# Patient Record
Sex: Female | Born: 1950 | Race: White | Hispanic: No | Marital: Married | Smoking: Never smoker
Health system: Southern US, Community
[De-identification: ages and names within clinical notes are randomized; demographics above are authoritative.]

---

## 2015-08-11 ENCOUNTER — Encounter: Payer: Self-pay | Admitting: Sports Medicine

## 2015-08-11 ENCOUNTER — Ambulatory Visit (INDEPENDENT_AMBULATORY_CARE_PROVIDER_SITE_OTHER): Payer: BC Managed Care – PPO

## 2015-08-11 ENCOUNTER — Ambulatory Visit (INDEPENDENT_AMBULATORY_CARE_PROVIDER_SITE_OTHER): Payer: BC Managed Care – PPO | Admitting: Sports Medicine

## 2015-08-11 VITALS — BP 146/83 | HR 76 | Wt 223.0 lb

## 2015-08-11 DIAGNOSIS — M533 Sacrococcygeal disorders, not elsewhere classified: Secondary | ICD-10-CM | POA: Diagnosis not present

## 2015-08-11 DIAGNOSIS — M609 Myositis, unspecified: Secondary | ICD-10-CM

## 2015-08-11 DIAGNOSIS — M545 Low back pain, unspecified: Secondary | ICD-10-CM

## 2015-08-11 DIAGNOSIS — IMO0001 Reserved for inherently not codable concepts without codable children: Secondary | ICD-10-CM

## 2015-08-11 DIAGNOSIS — M791 Myalgia: Secondary | ICD-10-CM | POA: Diagnosis not present

## 2015-08-11 DIAGNOSIS — M47818 Spondylosis without myelopathy or radiculopathy, sacral and sacrococcygeal region: Secondary | ICD-10-CM

## 2015-08-11 DIAGNOSIS — M461 Sacroiliitis, not elsewhere classified: Secondary | ICD-10-CM | POA: Insufficient documentation

## 2015-08-11 MED ORDER — CYCLOBENZAPRINE HCL 10 MG PO TABS
ORAL_TABLET | ORAL | Status: DC
Start: 2015-08-11 — End: 2015-09-11

## 2015-08-11 MED ORDER — MELOXICAM 15 MG PO TABS: ORAL_TABLET | ORAL | Status: DC

## 2015-08-11 NOTE — Progress Notes (Signed)
   Subjective:    I'm seeing this patient as a consultation for:  Dr. Julius Bowels  CC:  Low back pain  HPI: This is a pleasant 64 year old female with a history of breast cancer comes in with a three-week history of pain in the left side of her low back without radiculopathy, no bowel or bladder dysfunction, saddle numbness, pain is worse when laying flat in bed, moderate, persistent, no radiation. No constitutional symptoms. She does desire aggressive interventional treatment today.  Past medical history, Surgical history, Family history not pertinant except as noted below, Social history, Allergies, and medications have been entered into the medical record, reviewed, and no changes needed.   Review of Systems: No headache, visual changes, nausea, vomiting, diarrhea, constipation, dizziness, abdominal pain, skin rash, fevers, chills, night sweats, weight loss, swollen lymph nodes, body aches, joint swelling, muscle aches, chest pain, shortness of breath, mood changes, visual or auditory hallucinations.   Objective:   General: Well Developed, well nourished, and in no acute distress.  Neuro/Psych: Alert and oriented x3, extra-ocular muscles intact, able to move all 4 extremities, sensation grossly intact. Skin: Warm and dry, no rashes noted.  Respiratory: Not using accessory muscles, speaking in full sentences, trachea midline.  Cardiovascular: Pulses palpable, no extremity edema. Abdomen: Does not appear distended. Back Exam:  Inspection: Unremarkable  Motion: Flexion 45 deg, Extension 45 deg, Side Bending to 45 deg bilaterally,  Rotation to 45 deg bilaterally  SLR laying: Negative  XSLR laying: Negative  Palpable tenderness: left sacroiliac joint. FABER: negative. Sensory change: Gross sensation intact to all lumbar and sacral dermatomes.  Reflexes: 2+ at both patellar tendons, 2+ at achilles tendons, Babinski's downgoing.  Strength at foot  Plantar-flexion: 5/5 Dorsi-flexion: 5/5  Eversion: 5/5 Inversion: 5/5  Leg strength  Quad: 5/5 Hamstring: 5/5 Hip flexor: 5/5 Hip abductors: 5/5  Gait unremarkable.  Procedure: Real-time Ultrasound Guided Injection of left sacroiliac joint Device: GE Logiq E  Verbal informed consent obtained.  Time-out conducted.  Noted no overlying erythema, induration, or other signs of local infection.  Skin prepped in a sterile fashion.  Local anesthesia: Topical Ethyl chloride.  With sterile technique and under real time ultrasound guidance:  Spinal needle advanced into the SI joint taking care to avoid the S1 foramen, 1 mL Kenalog 40, 4 mL lidocaine injected easily, there was concordant pain during the injection Completed without difficulty  Pain immediately resolved suggesting accurate placement of the medication.  Advised to call if fevers/chills, erythema, induration, drainage, or persistent bleeding.  Images permanently stored and available for review in the ultrasound unit.  Impression: Technically successful ultrasound guided injection.  Impression and Recommendations:   This case required medical decision making of moderate complexity.

## 2015-08-11 NOTE — Assessment & Plan Note (Signed)
Pain is axial and localized over the left sacroiliac joint. Nothing radicular. She does have a history of breast cancer so we will obtain reimaging with an x-ray. Sacroiliac joint injection, formal physical therapy, meloxicam, Flexeril at bedtime.  Return in one month .

## 2015-08-22 ENCOUNTER — Encounter: Payer: Self-pay | Admitting: Rehabilitative and Restorative Service Providers"

## 2015-08-22 ENCOUNTER — Ambulatory Visit (INDEPENDENT_AMBULATORY_CARE_PROVIDER_SITE_OTHER): Payer: BC Managed Care – PPO | Admitting: Rehabilitative and Restorative Service Providers"

## 2015-08-22 DIAGNOSIS — R293 Abnormal posture: Secondary | ICD-10-CM

## 2015-08-22 DIAGNOSIS — Z7409 Other reduced mobility: Secondary | ICD-10-CM | POA: Diagnosis not present

## 2015-08-22 DIAGNOSIS — R531 Weakness: Secondary | ICD-10-CM

## 2015-08-22 DIAGNOSIS — R6889 Other general symptoms and signs: Secondary | ICD-10-CM

## 2015-08-22 DIAGNOSIS — M545 Low back pain, unspecified: Secondary | ICD-10-CM

## 2015-08-22 DIAGNOSIS — M623 Immobility syndrome (paraplegic): Secondary | ICD-10-CM | POA: Diagnosis not present

## 2015-08-22 DIAGNOSIS — M2560 Stiffness of unspecified joint, not elsewhere classified: Secondary | ICD-10-CM

## 2015-08-22 NOTE — Patient Instructions (Signed)
Abdominal Bracing With Pelvic Floor (Hook-Lying) With neutral spine, tighten pelvic floor and tighten abdominals sucking belly button to back bone; tighten back muscles at waist.  Repeat _10__ times.Hold 10-20 sec  Do _several __ times a day. Start lying on back progress to sitting; standing; walking and shelving books(functional activities)    HIP: Hamstrings - Supine Place strap around foot. Raise leg up, keep knee straight.  Hold _30__ seconds. _3__ reps per set, __2-3_ sets per day   IT band stretch  As above bring leg across body keeping knee straight Hold 30 sec 3 reps 2-3 times/day

## 2015-08-22 NOTE — Therapy (Signed)
[facility] [place] [address] [place], [place], [address] Phone: [phone]   Fax:  [phone]  Physical Therapy Evaluation  Patient Details  Name: Meredith Mcgee MRN: [id number] Date of Birth: [birth year] Referring Provider:  Monica Becton,*  Encounter Date: 08/22/2015      PT End of Session - 08/22/15 1115    Visit Number 1   Number of Visits 12   Date for PT Re-Evaluation 10/17/15   PT Start Time 1018   PT Stop Time 1108   PT Time Calculation (min) 50 min   Activity Tolerance Patient tolerated treatment well      History reviewed. No pertinent past medical history.  History reviewed. No pertinent past surgical history.  There were no vitals filed for this visit.  Visit Diagnosis:  Left-sided low back pain without sciatica - Plan: PT plan of care cert/re-cert  Stiffness due to immobility - Plan: PT plan of care cert/re-cert  Abnormal posture - Plan: PT plan of care cert/re-cert  Decreased strength, endurance, and mobility - Plan: PT plan of care cert/re-cert      Subjective Assessment - 08/22/15 1027    Subjective Meredith Mcgee reports that she began having Lt LBP 3-4 weeks ago. She awoke one morning with back pain. Symptoms wiould improve during the day but return at night, awakening her form sleep. Seen by Dr T and treated with injection on SI joint with some improvement after a couple of days. He also treated her with NASIDs and muscle relaxant with good improvement. Patient has been using her TENS unit at home to treat pain.   Pertinent History PT for Lt LE radiculopathy for ~3 months 2013 symptoms resloved   How long can you sit comfortably? no limit   How long can you stand comfortably? 3-4 hours   How long can you walk comfortably? 30 min   Diagnostic tests xray - DDD   Patient Stated Goals Get rid of the pain - prevent pain from coming back   Currently in Pain? No/denies   Pain Location Back   Pain  Orientation Left   Pain Type Acute pain   Pain Radiating Towards across back at times not lately   Pain Onset 1 to 4 weeks ago   Pain Frequency Intermittent   Aggravating Factors  unknown   Pain Relieving Factors meds; TENS unit            [facility] PT Assessment - 08/22/15 0001    Assessment   Medical Diagnosis Lt LBP   Onset Date/Surgical Date 08/04/15   Hand Dominance Right   Next MD Visit 09/11/15   Prior Therapy 2013 for Lt lumbar radiculopathy   Precautions   Precautions None   Balance Screen   Has the patient fallen in the past 6 months No   Has the patient had a decrease in activity level because of a fear of falling?  No   Is the patient reluctant to leave their home because of a fear of falling?  No   Home Environment   Additional Comments no difficulty    Prior Function   Level of Independence Independent   Vocation Retired;Part time employment   [employer] 15 hours/wk no longer than 5 hr/day standing; walking; sitting; bending; lifting books; reshelving   Observation/Other Assessments   Focus on Therapeutic Outcomes (FOTO)  27% limitation   Sensation   Additional Comments WFL's    Posture/Postural Control   Posture Comments head forward;  increased thoracic kyphosis; head of the humerus anterior in orientation; knees hyperextended; LE's ER    AROM   Lumbar Flexion 50%  pull in hamstrings   Lumbar Extension 60%   Lumbar - Right Side Bend 55%   Lumbar - Left Side Bend 55%  pull in Lt LB   Lumbar - Right Rotation 35%   Lumbar - Left Rotation 30%   Strength   Overall Strength Comments 5/5 bilat LE's except hip extension 4+/5 bilat   Flexibility   Hamstrings Rt 61 deg; Lt 64 deg   Quadriceps 6-7 in heel to buttock   ITB tight bilat   Piriformis tight Lt > Rt   Palpation   Spinal mobility WFL - mild tenderness lumbar spine with PA mobs - no pain   Palpation comment tightness Lt piriformis/hip abductors                   [facility]  Adult PT Treatment/Exercise - 08/22/15 0001    Self-Care   Self-Care --  spine care; importance of regular exercise   Neuro Re-ed    Neuro Re-ed Details  postural correction   Lumbar Exercises: Stretches   Passive Hamstring Stretch 3 reps;30 seconds  with strap   ITB Stretch 3 reps;30 seconds  with strap   Lumbar Exercises: Supine   AB Set Limitations 3 part core 10 sec hold 10 reps                PT Education - 08/22/15 1114    Education provided Yes   Education Details importance of regular exercise; postrural education; HEP   Person(s) Educated Patient   Methods Explanation;Demonstration;Tactile cues;Verbal cues;Handout   Comprehension Verbalized understanding;Returned demonstration;Verbal cues required;Tactile cues required             PT Long Term Goals - 08/22/15 1125    PT LONG TERM GOAL #1   Title Patient I in HEP for discharge 10/17/15   Time 6   Period Weeks   Status New   PT LONG TERM GOAL #2   Title Patient reports consistency with reuglar HEP 10/17/15   Time 6   Period Weeks   Status New   PT LONG TERM GOAL #3   Title Improve hamstring flexibility 10-12 degress bilat 10/17/15   Time 6   Period Weeks   Status New   PT LONG TERM GOAL #4   Title Improve FOTO to </=23% limitation 10/17/15   Time 6   Period Weeks   Status New   PT LONG TERM GOAL #5   Title Increase strengtrh bialt hip extension to 5/5 10/17/15   Time 6   Period Weeks   Status New               Plan - 08/22/15 1118    Clinical Impression Statement Meredith Mcgee presents with reolving Lt lumbar pain without radiculopathy. She has limited trunk and LE mobility/ROM; poor posture and alignment; weakness bilat hip extension; tightness through Lt piriformis and bilat hamstrings and quads; limited functional activity level. She has responded well to injectionand meds as well asa home TENS unit to manage pain and has no pain today. Meredith Mcgee will benefit from PT to address problems as  noted and work to improve core stability, progressing patient to I HEP on a regular basis to prevent recurrent problems.    Pt will benefit from skilled therapeutic intervention in order to improve on the following deficits Postural dysfunction;Decreased range of motion;Decreased mobility;Decreased strength;Increased  fascial restricitons;Decreased endurance;Decreased activity tolerance;Pain   Rehab Potential Good   PT Frequency 1x / week   PT Duration 6 weeks   PT Treatment/Interventions Patient/family education;ADLs/Self Care Home Management;Therapeutic exercise;Therapeutic activities;Dry needling;Manual techniques;Cryotherapy;Electrical Stimulation;Moist Heat;Ultrasound   PT Next Visit Plan check HEP especially 3 part core; progress lumbar stabilization as tolerated; add doorway stretch and posterior shoulder girdle strengthening; modalities only as needed to address pain - focus will be on preventing recurrent problems   PT Home Exercise Plan HEP    Consulted and Agree with Plan of Care Patient         Problem List Patient Active Problem List   Diagnosis Date Noted  . Left-sided low back pain without sciatica 08/11/2015    Iliyana Convey Rober Minion PT, MPH 08/22/2015, 11:34 AM  [facility] [address] [place] [address] [place], [place], [address] Phone: [phone]   Fax:  [phone]

## 2015-09-01 ENCOUNTER — Encounter: Payer: Self-pay | Admitting: Physical Therapy

## 2015-09-07 ENCOUNTER — Ambulatory Visit (INDEPENDENT_AMBULATORY_CARE_PROVIDER_SITE_OTHER): Payer: BC Managed Care – PPO | Admitting: Physical Therapy

## 2015-09-07 DIAGNOSIS — Z7409 Other reduced mobility: Secondary | ICD-10-CM | POA: Diagnosis not present

## 2015-09-07 DIAGNOSIS — M623 Immobility syndrome (paraplegic): Secondary | ICD-10-CM | POA: Diagnosis not present

## 2015-09-07 DIAGNOSIS — R293 Abnormal posture: Secondary | ICD-10-CM | POA: Diagnosis not present

## 2015-09-07 DIAGNOSIS — R531 Weakness: Secondary | ICD-10-CM

## 2015-09-07 DIAGNOSIS — R6889 Other general symptoms and signs: Secondary | ICD-10-CM

## 2015-09-07 DIAGNOSIS — M2560 Stiffness of unspecified joint, not elsewhere classified: Secondary | ICD-10-CM

## 2015-09-07 NOTE — Patient Instructions (Signed)
Piriformis Stretch, Sitting PNF    Sit, one ankle on opposite knee, same-side hand on crossed knee. Push down on knee, keeping spine straight. Lean torso forward until tension is felt in hamstrings and gluteals of crossed-leg side. Contract hip muscles so knee rotates further toward floor, resisting with hand. Hold _30__ seconds, 2 reps. Release tension and bring torso closer to calf. Do _1-2__ sessions per day.   Hamstring Step 2    Left foot relaxed, knee straight, other leg bent, foot flat. Raise straight leg further upward to maximal range. Hold 30___ seconds. Relax leg completely down. Repeat _2__ times.   Outer Hip Stretch: Reclined IT Band Stretch (Strap)    Strap around opposite foot, pull across only as far as possible with shoulders on mat. Hold for __30__ seconds. Repeat _2-3___ times each leg.  Abdominal Bracing With Pelvic Floor (Hook-Lying)   With neutral spine, tighten pelvic floor and abdominals. Hold 10 seconds. Repeat __10_ times. Do _1__ times a day.    Knee to Chest: Transverse Plane Stability  Tighten abdominal muscles.  Bring one knee up, then return. Be sure pelvis does not roll side to side. Keep pelvis still. Lift knee __10_ times each leg. Restabilize pelvis. Repeat with other leg. Do _1-2__ sets, _1__ times per day.   Hip External Rotation With Pillow: Transverse Plane Stability  Tighten abdominal muscles. Keep both knees bent. Slowly roll bent knee out. Be sure pelvis does not rotate. Do _10__ times. Restabilize pelvis. Repeat with other leg. Do _1-2__ sets, _1__ times per day.  Heel Slide: 4-10 Inches - Transverse Plane Stability  Tighten abdominal muscles.  Slide heel 4 inches down. Be sure pelvis does not rotate. Do _5-10__ times. Restabilize pelvis. Repeat with other leg. Do __1_ sets, _1__ times per day.   [facility] [address] [place], [place] [address]  [phone]  (office) [phone] (fax)

## 2015-09-07 NOTE — Therapy (Addendum)
[place], [place], [address] Phone: [phone]   Fax:  [phone]  Physical Therapy Treatment  Patient Details  Name: Meredith Mcgee MRN: [id number] Date of Birth: 1951/10/12 No Data Recorded  Encounter Date: 09/07/2015      PT End of Session - 09/07/15 1109    Visit Number 2   Number of Visits 12   Date for PT Re-Evaluation 10/17/15   PT Start Time 1107   PT Stop Time 4383   PT Time Calculation (min) 42 min   Activity Tolerance Patient tolerated treatment well;No increased pain      No past medical history on file.  No past surgical history on file.  There were no vitals filed for this visit.  Visit Diagnosis:  Stiffness due to immobility  Abnormal posture  Decreased strength, endurance, and mobility      Subjective Assessment - 09/07/15 1107    Subjective Pt reports she has tightness in morning in low back, no longer pain in same area. Stopped taking NSAID and muscle relaxers 1 wk ago.    Currently in Pain? No/denies            [facility] PT Assessment - 09/07/15 0001    Assessment   Medical Diagnosis Lt LBP   Onset Date/Surgical Date 08/04/15   Hand Dominance Right   Next MD Visit 09/11/15   Prior Therapy 2013 for Lt lumbar radiculopathy   Observation/Other Assessments   Focus on Therapeutic Outcomes (FOTO)  11% limited           [facility] Adult PT Treatment/Exercise - 09/07/15 0001    Lumbar Exercises: Stretches   Passive Hamstring Stretch 2 reps;30 seconds  (instructed in seated version)   Piriformis Stretch 2 reps;30 seconds   Lumbar Exercises: Aerobic   Stationary Bike NuStep L4: 15mn    Lumbar Exercises: Standing   Other Standing Lumbar Exercises doorway stretch 30 sec    Lumbar Exercises: Supine   Ab Set 5 reps;5 seconds   Clam 10 reps  each leg, with ab set   Heel Slides 5 reps  each leg, with ab set   Bent Knee Raise 10 reps  each leg, with ab set            PT Education - 09/07/15 1135    Education provided Yes   Education Details HEP- stretches and core exercises, doorway stretch   Person(s) Educated Patient   Methods Explanation;Handout   Comprehension Verbalized understanding             PT Long Term Goals - 09/07/15 1253    PT LONG TERM GOAL #1   Title Patient I in HEP for discharge 10/17/15   Time 6   Period Weeks   Status On-going   PT LONG TERM GOAL #2   Title Patient reports consistency with reuglar HEP 10/17/15   Time 6   Period Weeks   Status On-going   PT LONG TERM GOAL #3   Title Improve hamstring flexibility 10-12 degress bilat 10/17/15   Time 6   Period Weeks   Status On-going   PT LONG TERM GOAL #4   Title Improve FOTO to </=23% limitation 10/17/15   Time 6   Period Weeks   Status Achieved   PT LONG TERM GOAL #5   Title Increase strengtrh bialt hip extension to 5/5 10/17/15   Time 6   Period Weeks   Status On-going  Plan - 09/07/15 1136    Clinical Impression Statement Pt tolerated all exercises without production of pain. Pt has made good progress towards goalsmet LTG # 4. Pt interested in continuation of HEP on own, holding therapy for next 2 wks.  As long as there is no flare up in next 2 wks, pt desires to d/c at that time due to satisfaction of current level of function.    Pt will benefit from skilled therapeutic intervention in order to improve on the following deficits Postural dysfunction;Decreased range of motion;Decreased mobility;Decreased strength;Increased fascial restricitons;Decreased endurance;Decreased activity tolerance;Pain   Rehab Potential Good   PT Frequency 1x / week   PT Duration 6 weeks   PT Next Visit Plan Hold PT sessions for 2 wks.  If returning, continue and progress core stabilization exercises.    Consulted and Agree with Plan of Care Patient        Problem List Patient Active Problem List   Diagnosis Date Noted  . Left-sided low  back pain without sciatica 08/11/2015    Shelbie Hutching 09/07/2015, 12:54 PM  [facility] [place], [place], [address] Phone: [phone]   Fax:  [phone]  Name: Meredith Mcgee MRN: [id number] Date of Birth: Apr 22, 1951    PHYSICAL THERAPY DISCHARGE SUMMARY  Visits from Start of Care: 2  Current functional level related to goals / functional outcomes: Pt pleased with progress and confident with continuing I HEP    Remaining deficits: Needs to continue to work on exercises at home    Education / Equipment: HEP  Plan: Patient agrees to discharge.  Patient goals were met. Patient is being discharged due to meeting the stated rehab goals.  ?????    Celyn P. Helene Kelp PT, MPH 10/09/2015 1:56 PM

## 2015-09-11 ENCOUNTER — Encounter: Payer: Self-pay | Admitting: Sports Medicine

## 2015-09-11 ENCOUNTER — Ambulatory Visit (INDEPENDENT_AMBULATORY_CARE_PROVIDER_SITE_OTHER): Payer: BC Managed Care – PPO | Admitting: Sports Medicine

## 2015-09-11 VITALS — BP 151/70 | HR 66 | Wt 224.0 lb

## 2015-09-11 DIAGNOSIS — M47817 Spondylosis without myelopathy or radiculopathy, lumbosacral region: Secondary | ICD-10-CM | POA: Diagnosis not present

## 2015-09-11 DIAGNOSIS — M47818 Spondylosis without myelopathy or radiculopathy, sacral and sacrococcygeal region: Principal | ICD-10-CM

## 2015-09-11 DIAGNOSIS — M461 Sacroiliitis, not elsewhere classified: Secondary | ICD-10-CM

## 2015-09-11 NOTE — Assessment & Plan Note (Signed)
Good response with 100% relief after left sacroliac joint injection, formal physical therapy, continues to do well. This confirms the left sacroiliac joint as the pain generator, and we can inject her again as needed up to 4 times per year, she will continue with her exercises and establish primary care with one of our other providers. Certainly if persistence of pain she would be a candidate for sacroiliac joint radiofrequency ablation or a fusion.

## 2015-09-11 NOTE — Progress Notes (Signed)
  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 She does desire to establish care with this practice.  Past medical history, Surgical history, Family history not pertinant except as noted below, Social history, Allergies, and medications have been entered into the medical record, reviewed, and no changes needed.   Review of Systems: No fevers, chills, night sweats, weight loss, chest pain, or shortness of breath.   Objective:    General: Well Developed, well nourished, and in no acute distress.  Neuro: Alert and oriented x3, extra-ocular muscles intact, sensation grossly intact.  HEENT: Normocephalic, atraumatic, pupils equal round reactive to light, neck supple, no masses, no lymphadenopathy, thyroid nonpalpable.  Skin: Warm and dry, no rashes. Cardiac: Regular rate and rhythm, no murmurs rubs or gallops, no lower extremity edema.  Respiratory: Clear to auscultation bilaterally. Not using accessory muscles, speaking in full sentences.  Impression and Recommendations:    I spent 25 minutes with this patient, greater than 50% was face-to-face time counseling regarding the above diagnoses

## 2017-02-15 IMAGING — CR DG LUMBAR SPINE COMPLETE 4+V
5 series · 5 of 5 positions shown · non-contrast
Comparison: None.

CLINICAL DATA: Pt here with c/o lower left lumbar pain x3 weeks
with NKI, does not radiate to leg.

Had injection "at SI joint" today per pt.
Hx 0425 "radiculpathy" to lumbar area possible after picking up a
bag of dog food. Other than that, no hx prior injury/surgery.
EXAM:
LUMBAR SPINE - COMPLETE 4+ VIEW

[l-spine ap]
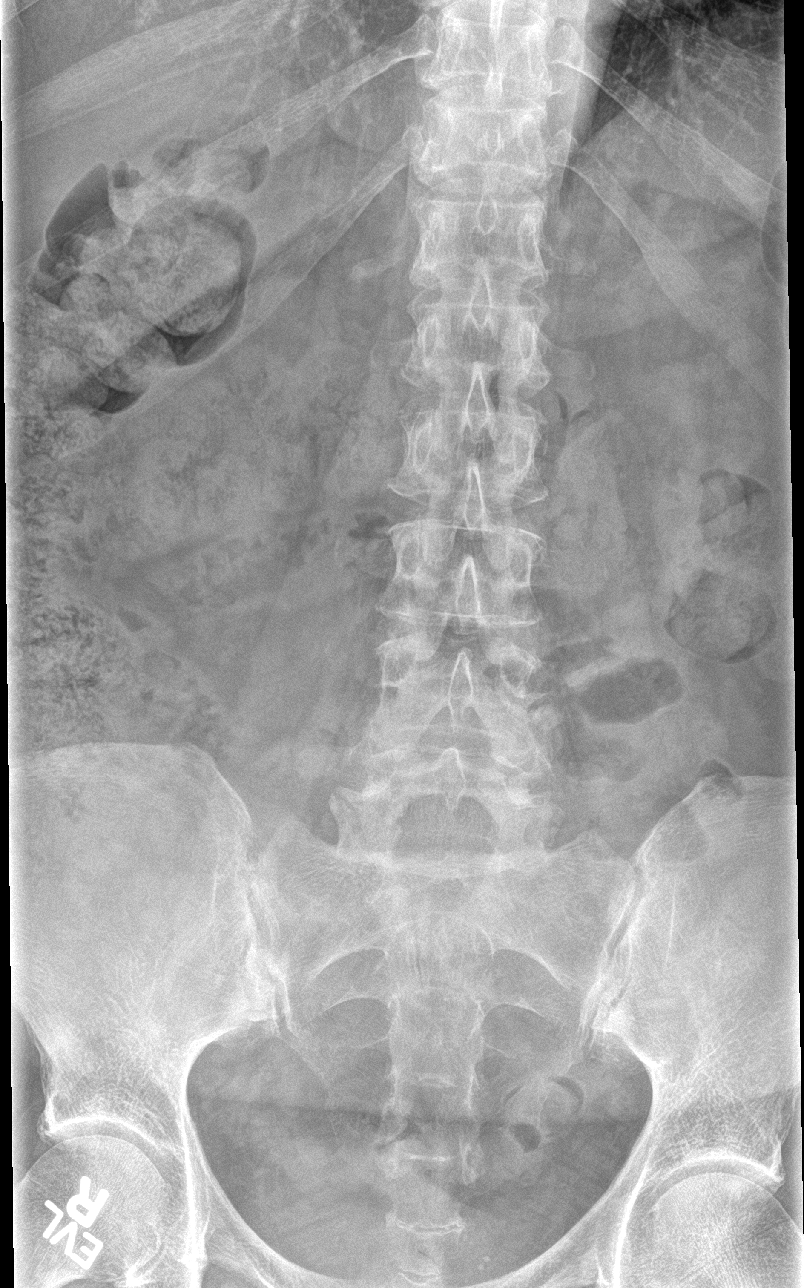

[l-spine obl (1 of 2)]
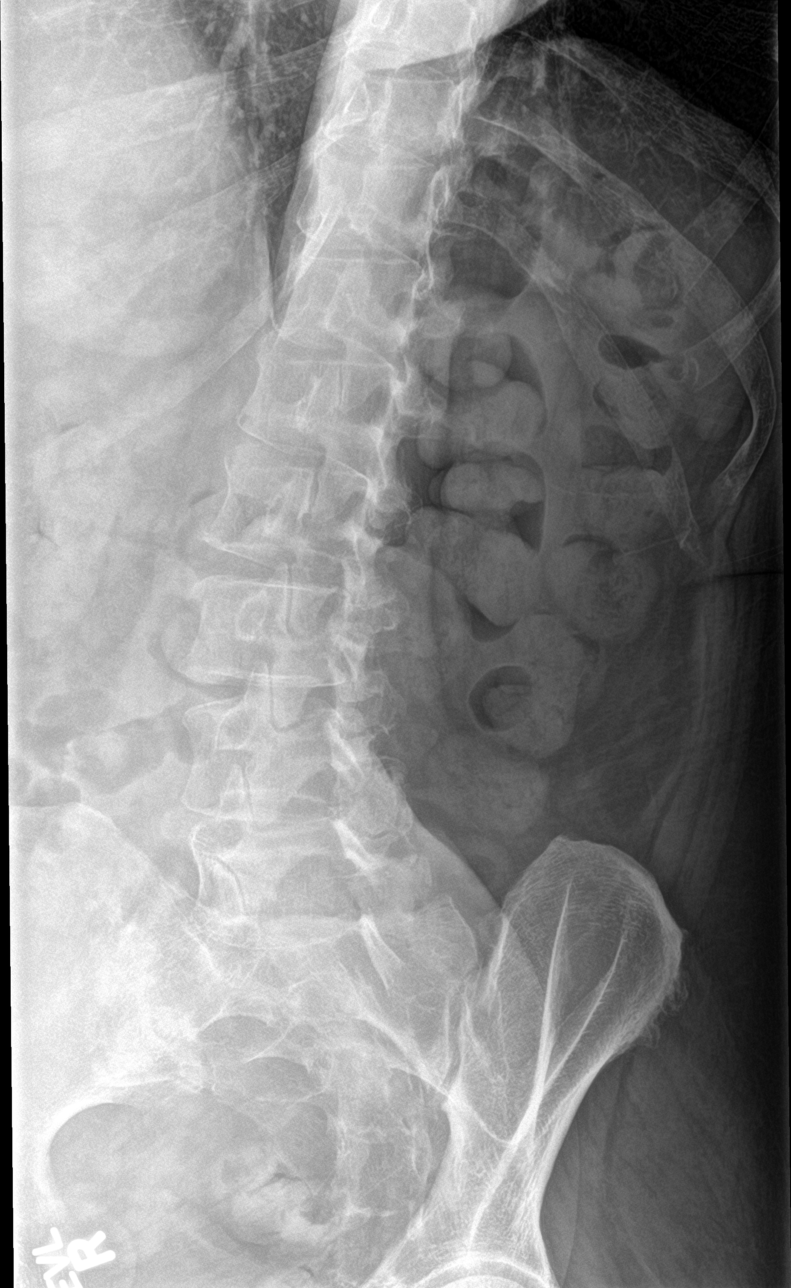

[l-spine obl (2 of 2)]
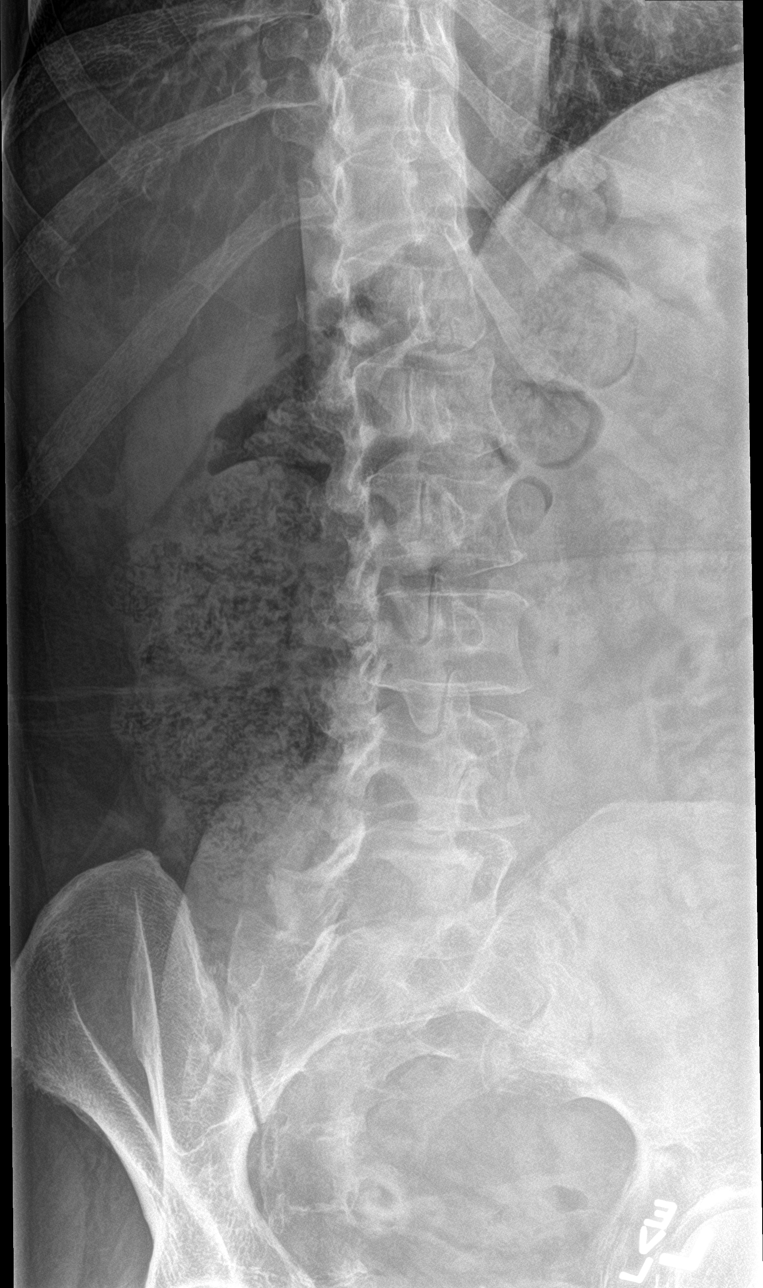

[l-spine lat]
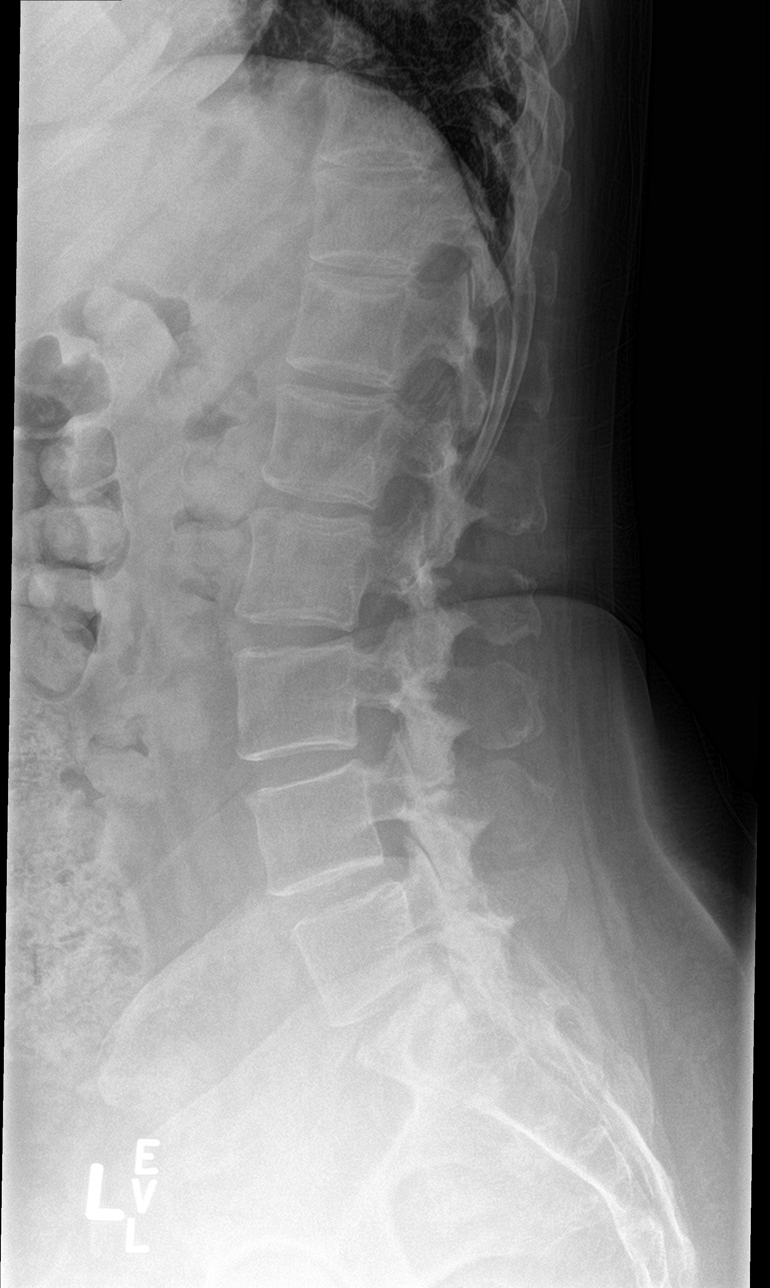

[l-spine spot]
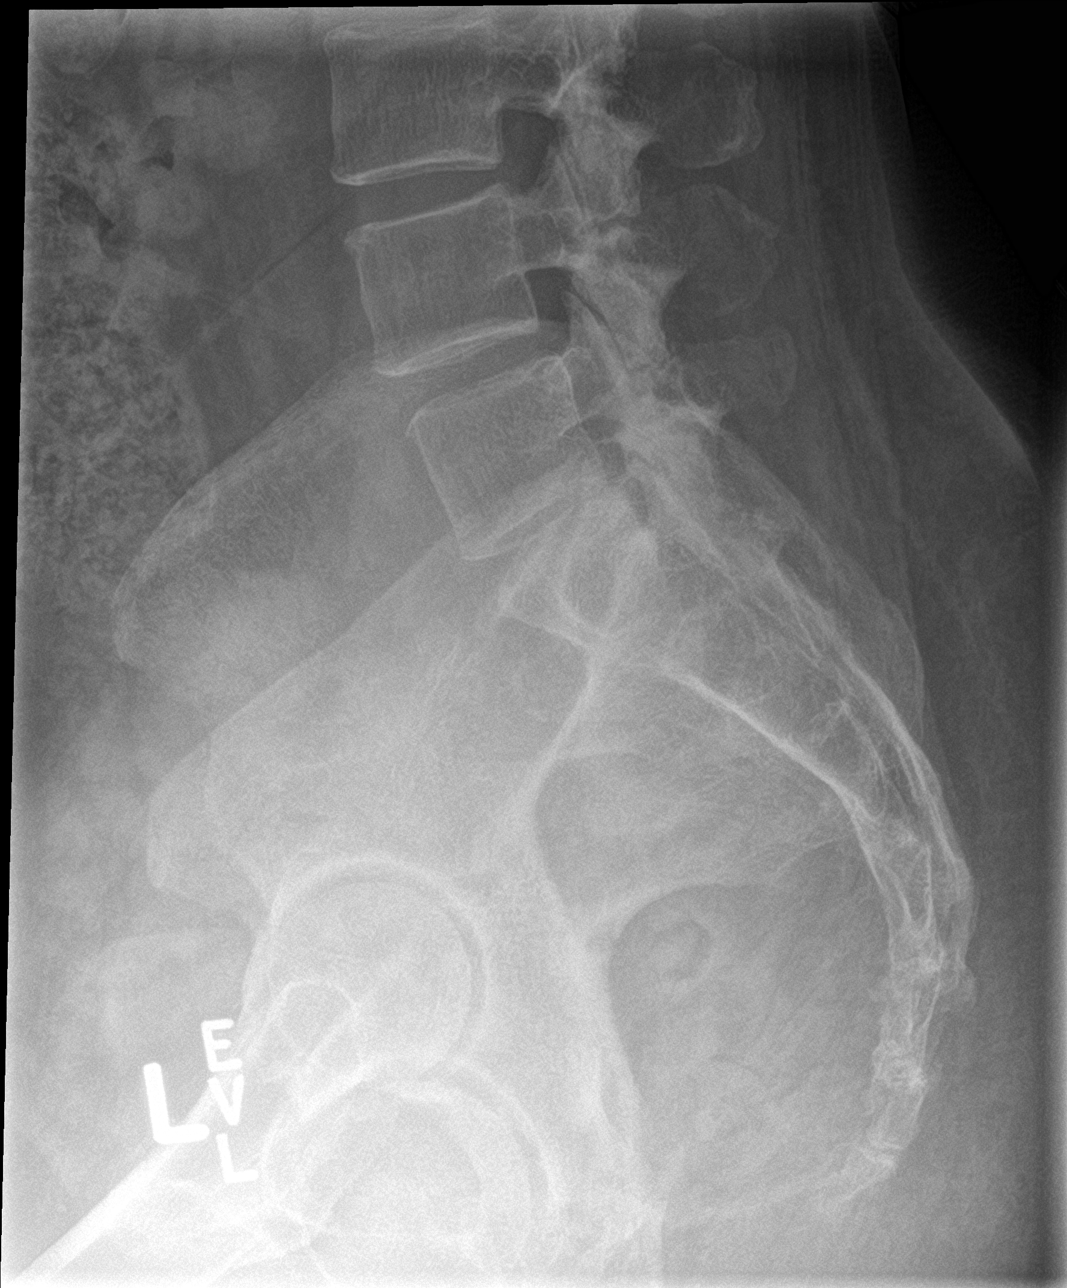

[5 of 5 positions shown; findings below may reference images not displayed]

FINDINGS: There is normal vertebral body stature. There is a grade 1
anterolisthesis of L4 on L5. No other spondylolisthesis. Mild loss
disc height at L4-L5 and L5-S1. Facet degenerative changes are
noted, mostly on the left, at L4-L5 and L5-S1.

Bones are demineralized.  Soft tissues are unremarkable.
IMPRESSION: 1. No fracture or acute finding.
2. Degenerative changes as detailed.

## 2017-04-18 ENCOUNTER — Encounter: Payer: Self-pay | Admitting: Sports Medicine

## 2017-04-18 ENCOUNTER — Ambulatory Visit (INDEPENDENT_AMBULATORY_CARE_PROVIDER_SITE_OTHER): Payer: BC Managed Care – PPO | Admitting: Sports Medicine

## 2017-04-18 ENCOUNTER — Ambulatory Visit (INDEPENDENT_AMBULATORY_CARE_PROVIDER_SITE_OTHER): Payer: BC Managed Care – PPO

## 2017-04-18 DIAGNOSIS — M7061 Trochanteric bursitis, right hip: Secondary | ICD-10-CM

## 2017-04-18 MED ORDER — MELOXICAM 15 MG PO TABS: ORAL_TABLET | ORAL | 3 refills | Status: DC

## 2017-04-18 NOTE — Assessment & Plan Note (Signed)
We will start conservative, physical therapy, meloxicam, x-rays. She does have her daughter's wedding in one month so I would like to see her at least a week before the wedding to do an injection should be required.

## 2017-04-18 NOTE — Progress Notes (Signed)
   Subjective:    I'm seeing this patient as a consultation for:  Dr. Albertina SenegalNelson Pollock  CC: Right hip pain  HPI: This is a pleasant 66 year old female, for the past couple of weeks she's had pain that she localizes over the lateral aspect of the right hip, severe, persistent, no radiation, no trauma, no change in physical activities. She has trouble sleeping on the ipsilateral side.  Her SI joint continues to be pain-free.  Past medical history:  Negative.  See flowsheet/record as well for more information.  Surgical history: Negative.  See flowsheet/record as well for more information.  Family history: Negative.  See flowsheet/record as well for more information.  Social history: Negative.  See flowsheet/record as well for more information.  Allergies, and medications have been entered into the medical record, reviewed, and no changes needed.   Review of Systems: No headache, visual changes, nausea, vomiting, diarrhea, constipation, dizziness, abdominal pain, skin rash, fevers, chills, night sweats, weight loss, swollen lymph nodes, body aches, joint swelling, muscle aches, chest pain, shortness of breath, mood changes, visual or auditory hallucinations.   Objective:   General: Well Developed, well nourished, and in no acute distress.  Neuro/Psych: Alert and oriented x3, extra-ocular muscles intact, able to move all 4 extremities, sensation grossly intact. Skin: Warm and dry, no rashes noted.  Respiratory: Not using accessory muscles, speaking in full sentences, trachea midline.  Cardiovascular: Pulses palpable, no extremity edema. Abdomen: Does not appear distended. Right Hip: ROM IR: 60 Deg, ER: 60 Deg, Flexion: 120 Deg, Extension: 100 Deg, Abduction: 45 Deg, Adduction: 45 Deg Strength IR: 5/5, ER: 5/5, Flexion: 5/5, Extension: 5/5, Abduction: 5/5, Adduction: 5/5 Pelvic alignment unremarkable to inspection and palpation. Standing hip rotation and gait without trendelenburg /  unsteadiness. Greater trochanter with severe tenderness to palpation. No tenderness over piriformis. No SI joint tenderness and normal minimal SI movement.  Impression and Recommendations:   This case required medical decision making of moderate complexity.  Greater trochanteric bursitis, right We will start conservative, physical therapy, meloxicam, x-rays. She does have her daughter's wedding in one month so I would like to see her at least a week before the wedding to do an injection should be required.

## 2017-04-21 ENCOUNTER — Ambulatory Visit (INDEPENDENT_AMBULATORY_CARE_PROVIDER_SITE_OTHER): Payer: BC Managed Care – PPO | Admitting: Rehabilitative and Restorative Service Providers"

## 2017-04-21 ENCOUNTER — Encounter: Payer: Self-pay | Admitting: Rehabilitative and Restorative Service Providers"

## 2017-04-21 DIAGNOSIS — M25551 Pain in right hip: Secondary | ICD-10-CM | POA: Diagnosis not present

## 2017-04-21 DIAGNOSIS — R531 Weakness: Secondary | ICD-10-CM

## 2017-04-21 DIAGNOSIS — R29898 Other symptoms and signs involving the musculoskeletal system: Secondary | ICD-10-CM | POA: Diagnosis not present

## 2017-04-21 DIAGNOSIS — R293 Abnormal posture: Secondary | ICD-10-CM

## 2017-04-21 NOTE — Therapy (Signed)
[facility] [place] [address] [place], [place], [address] Phone: [phone]   Fax:  [phone]  Physical Therapy Evaluation  Patient Details  Name: Meredith Mcgee MRN: [id number] Date of Birth: 06/21/1951 Referring Provider: Dr Benjamin Stain  Encounter Date: 04/21/2017      PT End of Session - 04/21/17 0937    Visit Number 1   Number of Visits 12   Date for PT Re-Evaluation 06/02/17   PT Start Time 0933   PT Stop Time 1030   PT Time Calculation (min) 57 min   Activity Tolerance Patient tolerated treatment well      History reviewed. No pertinent past medical history.  History reviewed. No pertinent surgical history.  There were no vitals filed for this visit.       Subjective Assessment - 04/21/17 0938    Subjective Meredith Mcgee reports that she has been having Rt hip pain since january 2018. She began walking up with pain in the Rt hip. Pain has persisted with no improvement.    Pertinent History LBP 2-3 years ago treated with PT with improvement; 2013 nerve impingement Lt LE out of work for 2-3 months; breast cancer 2000 bilat mastectomy   How long can you sit comfortably? no limit some stiffness    How long can you stand comfortably? no limit   How long can you walk comfortably? no limit - can walk ~ 30 min    Diagnostic tests xrays    Patient Stated Goals get rid of pain and improve stiffness    Currently in Pain? No/denies   Pain Location Hip   Pain Orientation Right   Pain Descriptors / Indicators Sharp;Stabbing   Pain Type Chronic pain   Pain Radiating Towards lateral thigh to mid thigh    Pain Onset More than a month ago   Pain Frequency Intermittent   Aggravating Factors  lying on the Rt side at night   Pain Relieving Factors heat            [facility] PT Assessment - 04/21/17 0001      Assessment   Medical Diagnosis Rt trochanteric bursitis   Referring Provider Dr Benjamin Stain   Onset Date/Surgical  Date 11/18/16   Hand Dominance Right   Next MD Visit 05/09/17   Prior Therapy here for back      Precautions   Precautions None     Balance Screen   Has the patient fallen in the past 6 months No   Has the patient had a decrease in activity level because of a fear of falling?  No   Is the patient reluctant to leave their home because of a fear of falling?  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 Standing -      AROM   Lumbar Flexion 75%   Lumbar Extension 50%  Lumbar - Right Side Bend 70%  discomfort Rt hip    Lumbar - Left Side Bend 75%   Lumbar - Right Rotation 50%   Lumbar - Left Rotation 50%     Strength   Overall Strength Comments 5/5 bilat LE's except as noted    Right Hip Extension --  5-/5   Right Hip ABduction 4+/5     Flexibility   Hamstrings tight bilat ~ 75 deg    Quadriceps tight bilat    ITB tight bilat Rt > Lt    Piriformis tight bilat Rt > Lt      Palpation   Spinal mobility WFL's    Palpation comment tight Rt hip abductors; piriformis; glut med/max; ITband      Ambulation/Gait   Gait Comments WFL's             Objective measurements completed on examination: See above findings.          [facility] Adult PT Treatment/Exercise - 04/21/17 0001      Self-Care   Self-Care --  avoid crossing ankles/legs; sleep positions; TENS unit      Knee/Hip Exercises: Stretches   Passive Hamstring  Stretch 2 reps;30 seconds  supine with strap    Quad Stretch 2 reps;30 seconds  supine with strap    ITB Stretch 2 reps;30 seconds  supine with strap    Piriformis Stretch 3 reps;30 seconds  travell varying angle supine with strap      Knee/Hip Exercises: Prone   Other Prone Exercises glut set 10 sec x 10     Moist Heat Therapy   Number Minutes Moist Heat 20 Minutes   Moist Heat Location Hip  Rt piriformis      Electrical Stimulation   Electrical Stimulation Location Rt piriformis/greater trochanter/ITBand    Electrical Stimulation Action IFC   Electrical Stimulation Parameters to tolerance   Electrical Stimulation Goals Tone;Pain                PT Education - 04/21/17 1000    Education provided Yes   Education Details HEP; sleeping positions   Person(s) Educated Patient   Methods Explanation;Demonstration;Tactile cues;Verbal cues;Handout   Comprehension Verbalized understanding;Returned demonstration;Verbal cues required;Tactile cues required             PT Long Term Goals - 04/21/17 1407      PT LONG TERM GOAL #1   Title Improve tissue extensibility through Rt hip with patient to perform stretching through Rt hip with no pain and good mobilty 06/02/17   Time 6   Period Weeks   Status New     PT LONG TERM GOAL #2   Title Patient reports awakening in the am without Rt hip pain 06/02/17   Time 6   Period Weeks   Status New     PT LONG TERM GOAL #3   Title Increase strength Lt hip to 5/5 in hip abduction and extension  06/02/17   Time 6   Period Weeks   Status New     PT LONG TERM GOAL #4   Title Independent in HEP 06/02/17   Time 6   Period Weeks   Status New     PT LONG TERM GOAL #5   Title 33% limitation 06/02/17   Time 6   Period Weeks   Status New                Plan - 04/21/17 1334    Clinical Impression Statement Meredith Mcgee  presents with a 6 month history of Rt hip pain. She was seen for low complexity evaluation. Symptoms started  in January with patient noting pain in the Rt hip when she was lying on the Rt side at night. Symptoms have persisted with little change over the past 6 months. Patient has poor posture and alignment; Limited trunk and LE mobilty; weakness through Rt hip; pain and muscular tightness to palpation Rt hip; limited funcitonal activities due to hip pain. She will benefit from PT to address problems identified.    Clinical Presentation Stable   Clinical Decision Making Low   Rehab Potential Good   PT Frequency 2x / week   PT Duration 6 weeks   PT Treatment/Interventions Patient/family education;ADLs/Self Care Home Management;Cryotherapy;Electrical Stimulation;Iontophoresis 4mg /ml Dexamethasone;Moist Heat;Ultrasound;Dry needling;Manual techniques;Therapeutic activities;Therapeutic exercise;Neuromuscular re-education   PT Next Visit Plan trial of DN Rt hip; review stretching; progress with core stabilization and LE strengthening; manual work Rt hip; modalities as indicated    Consulted and Agree with Plan of Care Patient      Patient will benefit from skilled therapeutic intervention in order to improve the following deficits and impairments:  Postural dysfunction, Improper body mechanics, Pain, Decreased range of motion, Decreased mobility, Decreased strength, Increased muscle spasms, Increased fascial restricitons, Decreased activity tolerance  Visit Diagnosis: Pain in right hip - Plan: PT plan of care cert/re-cert  Other symptoms and signs involving the musculoskeletal system - Plan: PT plan of care cert/re-cert  Weakness generalized - Plan: PT plan of care cert/re-cert  Abnormal posture - Plan: PT plan of care cert/re-cert     Problem List Patient Active Problem List   Diagnosis Date Noted  . Greater trochanteric bursitis, right 04/18/2017  . 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

## 2017-04-21 NOTE — Patient Instructions (Addendum)
HIP: Hamstrings - Supine   Place strap around foot. Raise leg up, keeping knee straight.  Bend opposite knee to protect back if indicated. Hold 30 seconds. 3 reps per set, 2-3 sets per day   Outer Hip Stretch: Reclined IT Band Stretch (Strap)   Strap around one foot, pull leg across body until you feel a pull or stretch, with shoulders on mat. Hold for 30 seconds. Repeat 3 times each leg. 2-3 times/day.  Piriformis Stretch   Lying on back, pull right knee toward opposite shoulder. Hold 30 seconds. Repeat 3 times. Do 2-3 sessions per day.   Quads / HF, Prone   Lie face down. Grasp one ankle with same-side hand. Use towel if needed to reach. Gently pull foot toward buttock.  Hold 30 seconds. Repeat 3 times per session. Do 2-3 sessions per day.   Gluteal Sets    Tighten buttocks while pressing pelvis to floor. Hold _10___ seconds. Repeat __10__ times per set. Do __1-2__ sets per session. Do __1-2__ sessions per day. .  Trigger Point Dry Needling  . What is Trigger Point Dry Needling (DN)? o DN is a physical therapy technique used to treat muscle pain and dysfunction. Specifically, DN helps deactivate muscle trigger points (muscle knots).  o A thin filiform needle is used to penetrate the skin and stimulate the underlying trigger point. The goal is for a local twitch response (LTR) to occur and for the trigger point to relax. No medication of any kind is injected during the procedure.   . What Does Trigger Point Dry Needling Feel Like?  o The procedure feels different for each individual patient. Some patients report that they do not actually feel the needle enter the skin and overall the process is not painful. Very mild bleeding may occur. However, many patients feel a deep cramping in the muscle in which the needle was inserted. This is the local twitch response.   Marland Kitchen. How Will I feel after the treatment? o Soreness is normal, and the onset of soreness may not occur for a  few hours. Typically this soreness does not last longer than two days.  o Bruising is uncommon, however; ice can be used to decrease any possible bruising.  o In rare cases feeling tired or nauseous after the treatment is normal. In addition, your symptoms may get worse before they get better, this period will typically not last longer than 24 hours.   . What Can I do After My Treatment? o Increase your hydration by drinking more water for the next 24 hours. o You may place ice or heat on the areas treated that have become sore, however, do not use heat on inflamed or bruised areas. Heat often brings more relief post needling. o You can continue your regular activities, but vigorous activity is not recommended initially after the treatment for 24 hours. o DN is best combined with other physical therapy such as strengthening, stretching, and other therapies.

## 2017-04-25 ENCOUNTER — Encounter: Payer: Self-pay | Admitting: Rehabilitative and Restorative Service Providers"

## 2017-04-25 ENCOUNTER — Ambulatory Visit (INDEPENDENT_AMBULATORY_CARE_PROVIDER_SITE_OTHER): Payer: BC Managed Care – PPO | Admitting: Rehabilitative and Restorative Service Providers"

## 2017-04-25 DIAGNOSIS — R29898 Other symptoms and signs involving the musculoskeletal system: Secondary | ICD-10-CM | POA: Diagnosis not present

## 2017-04-25 DIAGNOSIS — R531 Weakness: Secondary | ICD-10-CM | POA: Diagnosis not present

## 2017-04-25 DIAGNOSIS — R293 Abnormal posture: Secondary | ICD-10-CM | POA: Diagnosis not present

## 2017-04-25 DIAGNOSIS — M25551 Pain in right hip: Secondary | ICD-10-CM

## 2017-04-25 NOTE — Therapy (Signed)
[facility] [place] [address] [place], [place], [address] Phone: [phone]   Fax:  [phone]  Physical Therapy Treatment  Patient Details  Name: Meredith Mcgee MRN: [id number] Date of Birth: [birth year] Referring Provider: Dr Benjamin Stain   Encounter Date: 04/25/2017      PT End of Session - 04/25/17 0802    Visit Number 2   Number of Visits 12   Date for PT Re-Evaluation 06/02/17   PT Start Time 0802   PT Stop Time 0904   PT Time Calculation (min) 62 min   Activity Tolerance Patient tolerated treatment well      History reviewed. No pertinent past medical history.  History reviewed. No pertinent surgical history.  There were no vitals filed for this visit.      Subjective Assessment - 04/25/17 0805    Subjective Meredith Mcgee reports that she has had some improvement with the stretches. She has less pain at night - still using her heating pad at night. She has less and less time for the exercise with her daughters wedding coming up.    Currently in Pain? No/denies            [facility] PT Assessment - 04/25/17 0001      Assessment   Medical Diagnosis Rt trochanteric bursitis   Referring Provider Dr Benjamin Stain    Onset Date/Surgical Date 11/18/16   Hand Dominance Right   Next MD Visit 05/09/17   Prior Therapy here for back      Flexibility   Hamstrings tight bilat ~ 85 deg    Quadriceps tight bilat    ITB tight bilat Rt > Lt    Piriformis tight bilat Rt > Lt      Palpation   Palpation comment tight Rt hip abductors; piriformis; glut med/max; [facility] Adult PT Treatment/Exercise - 04/25/17 0001      Knee/Hip Exercises: Stretches   Passive Hamstring Stretch 2 reps;30 seconds  supine with strap    Quad Stretch 2 reps;30 seconds  supine with strap    ITB Stretch 2 reps;30 seconds  supine with strap    Piriformis Stretch 3 reps;30 seconds  travell varying angle supine  with strap    Piriformis Stretch Limitations varying angles opposite knee to chest      Knee/Hip Exercises: Aerobic   Nustep L5 x      Knee/Hip Exercises: Standing   Hip Abduction Right;Left;10 reps   Hip Extension Right;Left;10 reps     Knee/Hip Exercises: Supine   Bridges with Clamshell Strengthening;10 reps  green TB - core engaged      Moist Heat Therapy   Number Minutes Moist Heat 20 Minutes   Moist Heat Location Hip  Rt piriformis      Electrical Stimulation   Electrical Stimulation Location Rt piriformis/greater trochanter/ITBand    Electrical Stimulation Action IFC   Electrical Stimulation Parameters to tolerance   Electrical Stimulation Goals Tone;Pain     Manual Therapy   Manual therapy comments pt prone   Soft tissue mobilization Rt hip abductors/piriformis    Myofascial Release Rt posterior hip           Trigger Point Dry Needling - 04/25/17 0916    Consent Given? Yes   Education Handout Provided Yes   Muscles Treated Lower Body Gluteus maximus;Piriformis  Rt with estim  Gluteus Maximus Response Palpable increased muscle length   Piriformis Response Palpable increased muscle length              PT Education - 04/25/17 0821    Education provided Yes   Education Details HEP    Person(s) Educated Patient   Methods Explanation;Demonstration;Tactile cues;Verbal cues;Handout   Comprehension Verbalized understanding;Returned demonstration;Verbal cues required;Tactile cues required             PT Long Term Goals - 04/25/17 0808      PT LONG TERM GOAL #1   Title Improve tissue extensibility through Rt hip with patient to perform stretching through Rt hip with no pain and good mobilty 06/02/17   Time 6   Period Weeks   Status On-going     PT LONG TERM GOAL #2   Title Patient reports awakening in the am without Rt hip pain 06/02/17   Time 6   Period Weeks   Status On-going     PT LONG TERM GOAL #3   Title Increase strength Lt hip to  5/5 in hip abduction and extension  06/02/17   Time 6   Period Weeks   Status On-going     PT LONG TERM GOAL #4   Title Independent in HEP 06/02/17   Time 6   Period Weeks   Status On-going     PT LONG TERM GOAL #5   Title 33% limitation 06/02/17   Time 6   Period Weeks   Status On-going             Patient will benefit from skilled therapeutic intervention in order to improve the following deficits and impairments:     Visit Diagnosis: Pain in right hip  Other symptoms and signs involving the musculoskeletal system  Weakness generalized  Abnormal posture     Problem List Patient Active Problem List   Diagnosis Date Noted  . Greater trochanteric bursitis, right 04/18/2017  . 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

## 2017-04-25 NOTE — Patient Instructions (Addendum)
Strengthening: Hip Extension - Resisted    With tubing around right ankle, face anchor and pull leg straight back. Repeat __10__ times per set. Do __2-3__ sets per session. Do __1__ sessions per day.    Strengthening: Hip Abduction - Resisted    With tubing around right leg, other side toward anchor, extend leg out from side. Repeat __10__ times per set. Do _2-3___ sets per session. Do __1__ sessions per day.   Bridging    Slowly raise buttocks from floor, keeping core tight. Repeat _10__ times per set. Do _2-3___ sets per session. Do ___1_ sessions per day.   Hip Flexor, Quadricep Stretch: Belly Down (Strap)    Engage stable pelvic tilt. Hold top of foot with hand or strap. Hold for __30 sec. Repeat __2-3__ times each leg.      Trigger Point Dry Needling  . What is Trigger Point Dry Needling (DN)? o DN is a physical therapy technique used to treat muscle pain and dysfunction. Specifically, DN helps deactivate muscle trigger points (muscle knots).  o A thin filiform needle is used to penetrate the skin and stimulate the underlying trigger point. The goal is for a local twitch response (LTR) to occur and for the trigger point to relax. No medication of any kind is injected during the procedure.   . What Does Trigger Point Dry Needling Feel Like?  o The procedure feels different for each individual patient. Some patients report that they do not actually feel the needle enter the skin and overall the process is not painful. Very mild bleeding may occur. However, many patients feel a deep cramping in the muscle in which the needle was inserted. This is the local twitch response.   Marland Kitchen. How Will I feel after the treatment? o Soreness is normal, and the onset of soreness may not occur for a few hours. Typically this soreness does not last longer than two days.  o Bruising is uncommon, however; ice can be used to decrease any possible bruising.  o In rare cases feeling tired or  nauseous after the treatment is normal. In addition, your symptoms may get worse before they get better, this period will typically not last longer than 24 hours.   . What Can I do After My Treatment? o Increase your hydration by drinking more water for the next 24 hours. o You may place ice or heat on the areas treated that have become sore, however, do not use heat on inflamed or bruised areas. Heat often brings more relief post needling. o You can continue your regular activities, but vigorous activity is not recommended initially after the treatment for 24 hours. o DN is best combined with other physical therapy such as strengthening, stretching, and other therapies.

## 2017-04-30 ENCOUNTER — Ambulatory Visit (INDEPENDENT_AMBULATORY_CARE_PROVIDER_SITE_OTHER): Payer: BC Managed Care – PPO | Admitting: Rehabilitative and Restorative Service Providers"

## 2017-04-30 ENCOUNTER — Encounter: Payer: Self-pay | Admitting: Rehabilitative and Restorative Service Providers"

## 2017-04-30 DIAGNOSIS — M25551 Pain in right hip: Secondary | ICD-10-CM

## 2017-04-30 DIAGNOSIS — R531 Weakness: Secondary | ICD-10-CM | POA: Diagnosis not present

## 2017-04-30 DIAGNOSIS — R29898 Other symptoms and signs involving the musculoskeletal system: Secondary | ICD-10-CM

## 2017-04-30 NOTE — Therapy (Signed)
[facility] [address] [place], [place], [address] Phone: [phone]   Fax:  [phone]  Physical Therapy Treatment  Patient Details  Name: Meredith Mcgee MRN: [id number] Date of Birth: 10-01-1951 Referring Provider: Dr Benjamin Stainhekkekandam  Encounter Date: 04/30/2017      PT End of Session - 04/30/17 0929    Visit Number 3   Number of Visits 12   Date for PT Re-Evaluation 06/02/17   PT Start Time 0930   PT Stop Time 1027   PT Time Calculation (min) 57 min   Activity Tolerance Patient tolerated treatment well      History reviewed. No pertinent past medical history.  History reviewed. No pertinent surgical history.  There were no vitals filed for this visit.      Subjective Assessment - 04/30/17 0930    Subjective PAtient reports that her Rt hip is feeling some better even though she has not had as much time to do her exercises as she would like.    Currently in Pain? No/denies            [facility] PT Assessment - 04/30/17 0001      Assessment   Medical Diagnosis Rt trochanteric bursitis   Referring Provider Dr Benjamin Stainhekkekandam   Onset Date/Surgical Date 11/18/16   Hand Dominance Right   Next MD Visit 05/09/17   Prior Therapy here for back      Strength   Right Hip Extension --  5-/5   Right Hip ABduction 4+/5     Flexibility   Hamstrings tight bilat ~ 85 deg    Quadriceps tight bilat    ITB tight bilat Rt > Lt    Piriformis tight bilat Rt > Lt      Palpation   Palpation comment tight Rt hip abductors; piriformis; glut med/max; Peggye FormITband                      [facility] Adult PT Treatment/Exercise - 04/30/17 0001      Knee/Hip Exercises: Stretches   Passive Hamstring Stretch 2 reps;30 seconds  supine with strap    ITB Stretch 2 reps;30 seconds  supine with strap    Piriformis Stretch 3 reps;30 seconds  travell varying angle supine with strap    Piriformis Stretch Limitations varying angles  opposite knee to chest      Knee/Hip Exercises: Aerobic   Nustep L5 x  6 min      Knee/Hip Exercises: Standing   Hip Abduction Right;Left;10 reps;2 sets   Hip Extension Right;Left;10 reps;2 sets     Knee/Hip Exercises: Supine   Bridges with Clamshell Strengthening;10 reps  green TB - core engaged      Moist Heat Therapy   Number Minutes Moist Heat 20 Minutes   Moist Heat Location Hip  Rt piriformis      Electrical Stimulation   Electrical Stimulation Location Rt piriformis/greater trochanter/ITBand    Electrical Stimulation Action IFC   Electrical Stimulation Parameters to tolerance   Electrical Stimulation Goals Tone;Pain     Manual Therapy   Manual therapy comments pt prone   Soft tissue mobilization Rt hip abductors/piriformis    Myofascial Release Rt posterior hip           Trigger Point Dry Needling - 04/30/17 1006    Consent Given? Yes   Muscles Treated Lower Body --  Rt with estim    Gluteus Maximus Response Palpable increased muscle length   Piriformis Response  Palpable increased muscle length                   PT Long Term Goals - 04/30/17 0929      PT LONG TERM GOAL #1   Title Improve tissue extensibility through Rt hip with patient to perform stretching through Rt hip with no pain and good mobilty 06/02/17   Time 6   Period Weeks   Status On-going     PT LONG TERM GOAL #2   Title Patient reports awakening in the am without Rt hip pain 06/02/17   Time 6   Period Weeks   Status On-going     PT LONG TERM GOAL #3   Title Increase strength Lt hip to 5/5 in hip abduction and extension  06/02/17   Time 6   Period Weeks   Status On-going     PT LONG TERM GOAL #4   Title Independent in HEP 06/02/17   Time 6   Period Weeks   Status On-going     PT LONG TERM GOAL #5   Title 33% limitation 06/02/17   Time 6   Period Weeks   Status On-going               Plan - 04/30/17 1008    Clinical Impression Statement Meredith Mcgee demonstrates  continued improvement in Rt hip pain. She has improving mobility and decreased tightness to palpation. Her busy schedule planning her daughter's wedding keeps her from as much consistency with HEp as would be optimal and negates any progression of HEP - however she does continue to make progress toward goals with what exercise she can do and treatment in clinic. She responds well to DN and manual work.    Rehab Potential Good   PT Frequency 2x / week   PT Duration 6 weeks   PT Treatment/Interventions Patient/family education;ADLs/Self Care Home Management;Cryotherapy;Electrical Stimulation;Iontophoresis 4mg /ml Dexamethasone;Moist Heat;Ultrasound;Dry needling;Manual techniques;Therapeutic activities;Therapeutic exercise;Neuromuscular re-education   PT Next Visit Plan DN Rt hip as indicated; stretching; progress with core stabilization and LE strengthening; manual work Rt hip; modalities as indicated    Consulted and Agree with Plan of Care Patient      Patient will benefit from skilled therapeutic intervention in order to improve the following deficits and impairments:  Postural dysfunction, Improper body mechanics, Pain, Decreased range of motion, Decreased mobility, Decreased strength, Increased muscle spasms, Increased fascial restricitons, Decreased activity tolerance  Visit Diagnosis: Pain in right hip  Other symptoms and signs involving the musculoskeletal system  Weakness generalized     Problem List Patient Active Problem List   Diagnosis Date Noted  . Greater trochanteric bursitis, right 04/18/2017  . 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

## 2017-05-02 ENCOUNTER — Encounter: Payer: Self-pay | Admitting: Rehabilitative and Restorative Service Providers"

## 2017-05-02 ENCOUNTER — Ambulatory Visit (INDEPENDENT_AMBULATORY_CARE_PROVIDER_SITE_OTHER): Payer: BC Managed Care – PPO | Admitting: Rehabilitative and Restorative Service Providers"

## 2017-05-02 DIAGNOSIS — R29898 Other symptoms and signs involving the musculoskeletal system: Secondary | ICD-10-CM

## 2017-05-02 DIAGNOSIS — R531 Weakness: Secondary | ICD-10-CM

## 2017-05-02 DIAGNOSIS — M25551 Pain in right hip: Secondary | ICD-10-CM

## 2017-05-02 DIAGNOSIS — R293 Abnormal posture: Secondary | ICD-10-CM

## 2017-05-02 NOTE — Therapy (Signed)
[facility] [place] [address] [place] [address] [place], [place], [address] Phone: [phone]   Fax:  [phone]  Physical Therapy Treatment  Patient Details  Name: Meredith Mcgee MRN: [id number] Date of Birth: Apr 16, 1951 Referring Provider: Dr Benjamin Stain  Encounter Date: 05/02/2017      PT End of Session - 05/02/17 1529    Visit Number 4   Number of Visits 12   Date for PT Re-Evaluation 06/02/17   PT Start Time 1530   PT Stop Time 1624   PT Time Calculation (min) 54 min   Activity Tolerance Patient tolerated treatment well      History reviewed. No pertinent past medical history.  History reviewed. No pertinent surgical history.  There were no vitals filed for this visit.      Subjective Assessment - 05/02/17 1537    Subjective Patient reports that she was a little sore from the dry needling but felt much better - no pain until last night. She had some hip pain "out of the blue" when she went to bed. She has felt better today.    Currently in Pain? No/denies                         [facility] Adult PT Treatment/Exercise - 05/02/17 0001      Knee/Hip Exercises: Stretches   Passive Hamstring Stretch 2 reps;30 seconds  supine with strap    Quad Stretch 2 reps;30 seconds   ITB Stretch 2 reps;30 seconds  supine with strap    Piriformis Stretch 3 reps;30 seconds  travell varying angle supine with strap    Piriformis Stretch Limitations varying angles opposite knee to chest      Knee/Hip Exercises: Aerobic   Nustep L5 x  7 min      Knee/Hip Exercises: Standing   Hip Abduction Right;Left;10 reps;2 sets   Hip Extension Right;Left;10 reps;2 sets     Knee/Hip Exercises: Supine   Bridges with Clamshell Strengthening;10 reps  green TB - core engaged      Moist Heat Therapy   Number Minutes Moist Heat 20 Minutes   Moist Heat Location Hip  Rt piriformis      Electrical Stimulation   Electrical Stimulation  Location Rt piriformis/greater trochanter/ITBand    Electrical Stimulation Action IFC   Electrical Stimulation Parameters to tolerance   Electrical Stimulation Goals Tone;Pain     Manual Therapy   Manual therapy comments pt prone   Soft tissue mobilization Rt hip abductors/piriformis    Myofascial Release Rt posterior hip                      PT Long Term Goals - 04/30/17 0929      PT LONG TERM GOAL #1   Title Improve tissue extensibility through Rt hip with patient to perform stretching through Rt hip with no pain and good mobilty 06/02/17   Time 6   Period Weeks   Status On-going     PT LONG TERM GOAL #2   Title Patient reports awakening in the am without Rt hip pain 06/02/17   Time 6   Period Weeks   Status On-going     PT LONG TERM GOAL #3   Title Increase strength Lt hip to 5/5 in hip abduction and extension  06/02/17   Time 6   Period Weeks   Status On-going     PT LONG TERM GOAL #4   Title Independent  in HEP 06/02/17   Time 6   Period Weeks   Status On-going     PT LONG TERM GOAL #5   Title 33% limitation 06/02/17   Time 6   Period Weeks   Status On-going               Plan - 05/02/17 1601    Clinical Impression Statement Continued improvement overall with some flare ups likely related to activity level. Patient has not had time to do her exercises due to wedding planning. Continues to have less palpable tightness and improved function with decreased pain.    Rehab Potential Good   PT Frequency 2x / week   PT Duration 6 weeks   PT Treatment/Interventions Patient/family education;ADLs/Self Care Home Management;Cryotherapy;Electrical Stimulation;Iontophoresis 4mg /ml Dexamethasone;Moist Heat;Ultrasound;Dry needling;Manual techniques;Therapeutic activities;Therapeutic exercise;Neuromuscular re-education   PT Next Visit Plan DN Rt hip as indicated; stretching; progress with core stabilization and LE strengthening; manual work Rt hip; modalities as  indicated    Consulted and Agree with Plan of Care Patient      Patient will benefit from skilled therapeutic intervention in order to improve the following deficits and impairments:  Postural dysfunction, Improper body mechanics, Pain, Decreased range of motion, Decreased mobility, Decreased strength, Increased muscle spasms, Increased fascial restricitons, Decreased activity tolerance  Visit Diagnosis: Pain in right hip  Other symptoms and signs involving the musculoskeletal system  Weakness generalized  Abnormal posture     Problem List Patient Active Problem List   Diagnosis Date Noted  . Greater trochanteric bursitis, right 04/18/2017  . 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

## 2017-05-06 ENCOUNTER — Encounter: Payer: Self-pay | Admitting: Rehabilitative and Restorative Service Providers"

## 2017-05-06 ENCOUNTER — Ambulatory Visit (INDEPENDENT_AMBULATORY_CARE_PROVIDER_SITE_OTHER): Payer: BC Managed Care – PPO | Admitting: Rehabilitative and Restorative Service Providers"

## 2017-05-06 DIAGNOSIS — R531 Weakness: Secondary | ICD-10-CM

## 2017-05-06 DIAGNOSIS — R29898 Other symptoms and signs involving the musculoskeletal system: Secondary | ICD-10-CM

## 2017-05-06 DIAGNOSIS — M25551 Pain in right hip: Secondary | ICD-10-CM

## 2017-05-06 NOTE — Therapy (Addendum)
[place], [place], [address] Phone: [phone]   Fax:  [phone]  Physical Therapy Treatment  Patient Details  Name: Meredith Mcgee MRN: [id number] Date of Birth: Jun 28, 1951 Referring Provider: Dr Dianah Field  Encounter Date: 05/06/2017      PT End of Session - 05/06/17 0932    Visit Number 5   Number of Visits 12   Date for PT Re-Evaluation 06/02/17   PT Start Time 0929   PT Stop Time 1022   PT Time Calculation (min) 53 min   Activity Tolerance Patient tolerated treatment well      History reviewed. No pertinent past medical history.  History reviewed. No pertinent surgical history.  There were no vitals filed for this visit.      Subjective Assessment - 05/06/17 0933    Subjective Doing well - some soreness following DN but better. She has done some stretching at night and that seems to have made a difference in how she feels.    Currently in Pain? No/denies            [facility] PT Assessment - 05/06/17 0001      Assessment   Medical Diagnosis Rt trochanteric bursitis   Referring Provider Dr Dianah Field   Onset Date/Surgical Date 11/18/16   Hand Dominance Right   Next MD Visit 05/09/17   Prior Therapy for back      Strength   Right Hip Extension --  5-/5   Right Hip ABduction --  5-/5     Flexibility   Hamstrings tight bilat ~ 85 deg    Quadriceps tight bilat    ITB tight bilat Rt > Lt    Piriformis tight bilat Rt > Lt      Palpation   Palpation comment tight Rt hip abductors; piriformis; glut med/max; ITband - improving                      [facility] Adult PT Treatment/Exercise - 05/06/17 0001      Knee/Hip Exercises: Stretches   Passive Hamstring Stretch 2 reps;30 seconds  supine with strap    Quad Stretch 2 reps;30 seconds   ITB Stretch 2 reps;30 seconds  supine with strap    Piriformis Stretch 3 reps;30 seconds  travell varying angle supine  with strap    Piriformis Stretch Limitations varying angles opposite knee to chest      Knee/Hip Exercises: Aerobic   Nustep L5 x  8 min      Knee/Hip Exercises: Standing   Hip Abduction Right;Left;10 reps;2 sets   Hip Extension Right;Left;10 reps;2 sets     Knee/Hip Exercises: Supine   Bridges with Clamshell Strengthening;10 reps  green TB - core engaged      Moist Heat Therapy   Number Minutes Moist Heat 20 Minutes   Moist Heat Location Hip  Rt piriformis      Electrical Stimulation   Electrical Stimulation Location Rt piriformis/greater trochanter/ITBand    Electrical Stimulation Action IFC   Electrical Stimulation Parameters to tolerance   Electrical Stimulation Goals Tone;Pain     Manual Therapy   Manual therapy comments pt prone   Soft tissue mobilization Rt hip abductors/piriformis    Myofascial Release Rt posterior hip           Trigger Point Dry Needling - 05/06/17 1003    Consent Given? Yes   Muscles Treated Lower Body --  estim with DN   Gluteus  Maximus Response Palpable increased muscle length   Piriformis Response Palpable increased muscle length                   PT Long Term Goals - 05/06/17 0938      PT LONG TERM GOAL #1   Title Improve tissue extensibility through Rt hip with patient to perform stretching through Rt hip with no pain and good mobilty 06/02/17   Time 6   Period Weeks   Status Partially Met     PT LONG TERM GOAL #2   Title Patient reports awakening in the am without Rt hip pain 06/02/17   Time 6   Period Weeks   Status Achieved     PT LONG TERM GOAL #3   Title Increase strength Lt hip to 5/5 in hip abduction and extension  06/02/17   Time 6   Period Weeks   Status Achieved     PT LONG TERM GOAL #4   Title Independent in HEP 06/02/17   Time 6   Period Weeks   Status Achieved     PT LONG TERM GOAL #5   Title 33% limitation 06/02/17   Time 6   Period Weeks   Status On-going               Plan -  05/06/17 0934    Clinical Impression Statement Continued improvement - good response to DN manual work and stretching. Positive response to more consistent stretching at home. Returns to MD Friday. Will call to schedule additioinal appointments.    Rehab Potential Good   PT Frequency 2x / week   PT Duration 6 weeks   PT Treatment/Interventions Patient/family education;ADLs/Self Care Home Management;Cryotherapy;Electrical Stimulation;Iontophoresis 51m/ml Dexamethasone;Moist Heat;Ultrasound;Dry needling;Manual techniques;Therapeutic activities;Therapeutic exercise;Neuromuscular re-education   PT Next Visit Plan DN Rt hip as indicated; stretching; progress with core stabilization and LE strengthening; manual work Rt hip; modalities as indicated   Patient will call to schedule additional appointments as indicated.    Consulted and Agree with Plan of Care Patient      Patient will benefit from skilled therapeutic intervention in order to improve the following deficits and impairments:  Postural dysfunction, Improper body mechanics, Pain, Decreased range of motion, Decreased mobility, Decreased strength, Increased muscle spasms, Increased fascial restricitons, Decreased activity tolerance  Visit Diagnosis: Pain in right hip  Other symptoms and signs involving the musculoskeletal system  Weakness generalized     Problem List Patient Active Problem List   Diagnosis Date Noted  . Greater trochanteric bursitis, right 04/18/2017  . 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  Patient goals were partially met. Patient is being discharged due to not returning since the last visit.  ?????     Malaysha Arlen P. HHelene KelpPT, MPH 05/22/17 3:35 PM  '

## 2017-05-09 ENCOUNTER — Encounter: Payer: Self-pay | Admitting: Sports Medicine

## 2017-05-09 ENCOUNTER — Ambulatory Visit (INDEPENDENT_AMBULATORY_CARE_PROVIDER_SITE_OTHER): Payer: BC Managed Care – PPO | Admitting: Sports Medicine

## 2017-05-09 DIAGNOSIS — M7061 Trochanteric bursitis, right hip: Secondary | ICD-10-CM | POA: Diagnosis not present

## 2017-05-09 NOTE — Progress Notes (Signed)
  Subjective:    CC: Follow-up  HPI: Right greater trochanteric bursitis: Resolved with conservative measures.  Past medical history:  Negative.  See flowsheet/record as well for more information.  Surgical history: Negative.  See flowsheet/record as well for more information.  Family history: Negative.  See flowsheet/record as well for more information.  Social history: Negative.  See flowsheet/record as well for more information.  Allergies, and medications have been entered into the medical record, reviewed, and no changes needed.   Review of Systems: No fevers, chills, night sweats, weight loss, chest pain, or shortness of breath.   Objective:    General: Well Developed, well nourished, and in no acute distress.  Neuro: Alert and oriented x3, extra-ocular muscles intact, sensation grossly intact.  HEENT: Normocephalic, atraumatic, pupils equal round reactive to light, neck supple, no masses, no lymphadenopathy, thyroid nonpalpable.  Skin: Warm and dry, no rashes. Cardiac: Regular rate and rhythm, no murmurs rubs or gallops, no lower extremity edema.  Respiratory: Clear to auscultation bilaterally. Not using accessory muscles, speaking in full sentences.  Impression and Recommendations:    Greater trochanteric bursitis, right Resolved with physical therapy and meloxicam, return as needed.

## 2017-05-09 NOTE — Assessment & Plan Note (Signed)
Resolved with physical therapy and meloxicam, return as needed. 

## 2017-05-14 ENCOUNTER — Ambulatory Visit (INDEPENDENT_AMBULATORY_CARE_PROVIDER_SITE_OTHER): Payer: BC Managed Care – PPO | Admitting: Sports Medicine

## 2017-05-14 DIAGNOSIS — M7061 Trochanteric bursitis, right hip: Secondary | ICD-10-CM | POA: Diagnosis not present

## 2017-05-14 NOTE — Progress Notes (Signed)
  Subjective:    CC: Right hip pain  HPI: This is a pleasant 66 year old female, she returns for her right trochanteric bursitis, she did extremely well with physical therapy, oral medications to the point were she had no further pain, unfortunately over the past couple of days she's developed a severe recurrence of pain and desires interventional treatment today, pain is moderate, persistent, localized without radiation.  Past medical history:  Negative.  See flowsheet/record as well for more information.  Surgical history: Negative.  See flowsheet/record as well for more information.  Family history: Negative.  See flowsheet/record as well for more information.  Social history: Negative.  See flowsheet/record as well for more information.  Allergies, and medications have been entered into the medical record, reviewed, and no changes needed.   Review of Systems: No fevers, chills, night sweats, weight loss, chest pain, or shortness of breath.   Objective:    General: Well Developed, well nourished, and in no acute distress.  Neuro: Alert and oriented x3, extra-ocular muscles intact, sensation grossly intact.  HEENT: Normocephalic, atraumatic, pupils equal round reactive to light, neck supple, no masses, no lymphadenopathy, thyroid nonpalpable.  Skin: Warm and dry, no rashes. Cardiac: Regular rate and rhythm, no murmurs rubs or gallops, no lower extremity edema.  Respiratory: Clear to auscultation bilaterally. Not using accessory muscles, speaking in full sentences. Right Hip: ROM IR: 60 Deg, ER: 60 Deg, Flexion: 120 Deg, Extension: 100 Deg, Abduction: 45 Deg, Adduction: 45 Deg Strength IR: 5/5, ER: 5/5, Flexion: 5/5, Extension: 5/5, Abduction: 5/5, Adduction: 5/5 Pelvic alignment unremarkable to inspection and palpation. Standing hip rotation and gait without trendelenburg / unsteadiness. Greater trochanter with tenderness to palpation. No tenderness over piriformis. No SI joint  tenderness and normal minimal SI movement.  Procedure: Real-time Ultrasound Guided Injection of right greater trochanteric bursa Device: GE Logiq E  Verbal informed consent obtained.  Time-out conducted.  Noted no overlying erythema, induration, or other signs of local infection.  Skin prepped in a sterile fashion.  Local anesthesia: Topical Ethyl chloride.  With sterile technique and under real time ultrasound guidance:  Spinal needle advanced to the greater trochanter, I then injected 1 mL kenalog 40, 2 mL lidocaine, 2 mL bupivacaine. Completed without difficulty  Pain immediately resolved suggesting accurate placement of the medication.  Advised to call if fevers/chills, erythema, induration, drainage, or persistent bleeding.  Images permanently stored and available for review in the ultrasound unit.  Impression: Technically successful ultrasound guided injection.  Impression and Recommendations:    Greater trochanteric bursitis, right Did fantastic with therapy and oral medications, was pain-free 5 days ago, unsure what she did but had a rapid recurrence of pain. Desires injection, this was performed today with ultrasound guidance, return in one month.

## 2017-05-14 NOTE — Assessment & Plan Note (Signed)
Did fantastic with therapy and oral medications, was pain-free 5 days ago, unsure what she did but had a rapid recurrence of pain. Desires injection, this was performed today with ultrasound guidance, return in one month.

## 2017-05-15 ENCOUNTER — Ambulatory Visit: Payer: BC Managed Care – PPO | Admitting: Sports Medicine

## 2017-06-11 ENCOUNTER — Ambulatory Visit (INDEPENDENT_AMBULATORY_CARE_PROVIDER_SITE_OTHER): Payer: BC Managed Care – PPO | Admitting: Sports Medicine

## 2017-06-11 ENCOUNTER — Encounter: Payer: Self-pay | Admitting: Sports Medicine

## 2017-06-11 DIAGNOSIS — M7061 Trochanteric bursitis, right hip: Secondary | ICD-10-CM | POA: Diagnosis not present

## 2017-06-11 NOTE — Progress Notes (Signed)
  Subjective:    CC: Follow-up  HPI: Right rotator bursitis: Completely resolved and pain-free after injection at the last visit, no further questions.  Past medical history:  Negative.  See flowsheet/record as well for more information.  Surgical history: Negative.  See flowsheet/record as well for more information.  Family history: Negative.  See flowsheet/record as well for more information.  Social history: Negative.  See flowsheet/record as well for more information.  Allergies, and medications have been entered into the medical record, reviewed, and no changes needed.   Review of Systems: No fevers, chills, night sweats, weight loss, chest pain, or shortness of breath.   Objective:    General: Well Developed, well nourished, and in no acute distress.  Neuro: Alert and oriented x3, extra-ocular muscles intact, sensation grossly intact.  HEENT: Normocephalic, atraumatic, pupils equal round reactive to light, neck supple, no masses, no lymphadenopathy, thyroid nonpalpable.  Skin: Warm and dry, no rashes. Cardiac: Regular rate and rhythm, no murmurs rubs or gallops, no lower extremity edema.  Respiratory: Clear to auscultation bilaterally. Not using accessory muscles, speaking in full sentences. Right Hip: ROM IR: 60 Deg, ER: 60 Deg, Flexion: 120 Deg, Extension: 100 Deg, Abduction: 45 Deg, Adduction: 45 Deg Strength IR: 5/5, ER: 5/5, Flexion: 5/5, Extension: 5/5, Abduction: 5/5, Adduction: 5/5 Pelvic alignment unremarkable to inspection and palpation. Standing hip rotation and gait without trendelenburg / unsteadiness. Greater trochanter without tenderness to palpation. No tenderness over piriformis. No SI joint tenderness and normal minimal SI movement.  Impression and Recommendations:    Greater trochanteric bursitis, right Completely resolved, completed pain-free after injection at the last visit, happy with results.

## 2017-06-11 NOTE — Assessment & Plan Note (Signed)
Completely resolved, completed pain-free after injection at the last visit, happy with results.

## 2018-01-20 ENCOUNTER — Encounter: Payer: BC Managed Care – PPO | Admitting: Sports Medicine

## 2018-01-27 ENCOUNTER — Ambulatory Visit: Payer: Medicare Other | Admitting: Sports Medicine

## 2018-01-27 ENCOUNTER — Encounter: Payer: Self-pay | Admitting: Sports Medicine

## 2018-01-27 DIAGNOSIS — M5416 Radiculopathy, lumbar region: Secondary | ICD-10-CM | POA: Diagnosis not present

## 2018-01-27 MED ORDER — PREDNISONE 50 MG PO TABS: ORAL_TABLET | ORAL | 0 refills | Status: DC

## 2018-01-27 NOTE — Progress Notes (Signed)
Subjective:    I'm seeing this patient as a consultation for: Dr. Albertina Senegal  CC: Back and left leg pain  HPI: This is a pleasant 67 year old female, I treated her in the past for left sacroiliac joint dysfunction, this was a couple of years ago and she responded extremely well to a left SI joint injection with 100% pain relief.  Unfortunately just earlier this month she took a misstep, had pain in her back that radiated down the back of her left leg, to the back of the calf to the fifth toe.  Pain is worse with standing upright, better with flexion, no cramping in the legs, bowel or bladder dysfunction, saddle numbness, constitutional symptoms.  Moderate, persistent.  It does feel significantly different from her previous SI joint pain.  I reviewed the past medical history, family history, social history, surgical history, and allergies today and no changes were needed.  Please see the problem list section below in epic for further details.  Past Medical History: No past medical history on file. Past Surgical History: No past surgical history on file. Social History: Social History   Socioeconomic History  . Marital status: Married    Spouse name: None  . Number of children: None  . Years of education: None  . Highest education level: None  Social Needs  . Financial resource strain: None  . Food insecurity - worry: None  . Food insecurity - inability: None  . Transportation needs - medical: None  . Transportation needs - non-medical: None  Occupational History  . None  Tobacco Use  . Smoking status: Never Smoker  . Smokeless tobacco: Never Used  Substance and Sexual Activity  . Alcohol use: None  . Drug use: None  . Sexual activity: None  Other Topics Concern  . None  Social History Narrative  . None   Family History: No family history on file. Allergies: Allergies  Allergen Reactions  . Gluten Meal   . Oxycodone   . Sulfa Antibiotics    Medications: See  med rec.  Review of Systems: No headache, visual changes, nausea, vomiting, diarrhea, constipation, dizziness, abdominal pain, skin rash, fevers, chills, night sweats, weight loss, swollen lymph nodes, body aches, joint swelling, muscle aches, chest pain, shortness of breath, mood changes, visual or auditory hallucinations.   Objective:   General: Well Developed, well nourished, and in no acute distress.  Neuro:  Extra-ocular muscles intact, able to move all 4 extremities, sensation grossly intact.  Deep tendon reflexes tested were normal. Psych: Alert and oriented, mood congruent with affect. ENT:  Ears and nose appear unremarkable.  Hearing grossly normal. Neck: Unremarkable overall appearance, trachea midline.  No visible thyroid enlargement. Eyes: Conjunctivae and lids appear unremarkable.  Pupils equal and round. Skin: Warm and dry, no rashes noted.  Cardiovascular: Pulses palpable, no extremity edema. Back Exam:  Inspection: Unremarkable  Motion: Flexion 45 deg, Extension 45 deg, Side Bending to 45 deg bilaterally,  Rotation to 45 deg bilaterally  SLR laying: Negative  XSLR laying: Negative  Palpable tenderness: None. FABER: negative. Sensory change: Gross sensation intact to all lumbar and sacral dermatomes.  Reflexes: 2+ at both patellar tendons, 2+ at achilles tendons, Babinski's downgoing.  Strength at foot  Plantar-flexion: 5/5 Dorsi-flexion: 5/5 Eversion: 5/5 Inversion: 5/5  Leg strength  Quad: 5/5 Hamstring: 5/5 Hip flexor: 5/5 Hip abductors: 5/5  Gait unremarkable.  X-rays from the recent past show L4-5 and L5-S1 degenerative disc disease  Impression and Recommendations:  This case required medical decision making of moderate complexity.  Left lumbar radiculitis Likely left S1 radiculitis, axial symptoms are related to spinal stenosis. Burst of prednisone, she can continue meloxicam after she finishes prednisone, rehab exercises given, return to see me in 4-6  weeks. MR for interventional planning if no better. ___________________________________________ Ihor Austinhomas J. Benjamin Stainhekkekandam, M.D., ABFM., CAQSM. Primary Care and Sports Medicine  [facility]  Adjunct Instructor of Family Medicine  [employer]

## 2018-01-27 NOTE — Assessment & Plan Note (Signed)
Likely left S1 radiculitis, axial symptoms are related to spinal stenosis. Burst of prednisone, she can continue meloxicam after she finishes prednisone, rehab exercises given, return to see me in 4-6 weeks. MR for interventional planning if no better.

## 2018-02-13 ENCOUNTER — Telehealth: Payer: Self-pay

## 2018-02-13 NOTE — Telephone Encounter (Signed)
Meredith Mcgee reports left ankle swelling for the last week. She has never had swelling in the past. I advised to elevated over the weekend. She has been scheduled for Tuesday but will cancel if swelling resolves. Denies chest pain, shortness of breath or increase in salt intake. Advised her to elevate as much as possible.

## 2018-02-13 NOTE — Telephone Encounter (Signed)
This is not uncommon with lumbar radiculitis.  She can just keep an eye on it.

## 2018-02-16 NOTE — Telephone Encounter (Signed)
Left message advising patient of recommendations.  °

## 2018-02-17 ENCOUNTER — Encounter: Payer: Self-pay | Admitting: Sports Medicine

## 2018-02-17 ENCOUNTER — Ambulatory Visit: Payer: Medicare Other | Admitting: Sports Medicine

## 2018-02-17 ENCOUNTER — Ambulatory Visit (HOSPITAL_BASED_OUTPATIENT_CLINIC_OR_DEPARTMENT_OTHER)
Admission: RE | Admit: 2018-02-17 | Discharge: 2018-02-17 | Disposition: A | Discharge status: Home / Self Care | Payer: Medicare Other | Source: Ambulatory Visit | Attending: Sports Medicine | Admitting: Sports Medicine

## 2018-02-17 DIAGNOSIS — M5416 Radiculopathy, lumbar region: Secondary | ICD-10-CM

## 2018-02-17 DIAGNOSIS — M7989 Other specified soft tissue disorders: Secondary | ICD-10-CM

## 2018-02-17 DIAGNOSIS — I82402 Acute embolism and thrombosis of unspecified deep veins of left lower extremity: Secondary | ICD-10-CM | POA: Diagnosis not present

## 2018-02-17 MED ORDER — AMBULATORY NON FORMULARY MEDICATION: 0 refills | Status: AC

## 2018-02-17 MED ORDER — APIXABAN 5 MG PO TABS: ORAL_TABLET | ORAL | 3 refills | Status: DC

## 2018-02-17 NOTE — Assessment & Plan Note (Addendum)
Stat DVT ultrasound at [facility] [place]. Compression hose, we are ruling out a DVT but I do think this is likely from venous insufficiency. She will need to keep her follow-up with Dr. Julius BowelsPollock however I would like to see her back in 2 weeks. Left leg was strapped with compressive dressing.  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  Discussed with patient on the phone, sending in [facility] [place], patient will pick it up now.  She will also get thigh high compression hose to prevent post-thrombotic/phlebitic syndrome.  I would anticipate 6 months of treatment but I will defer to her PCP.  Advised to follow up with him sometime this week or next.

## 2018-02-17 NOTE — Addendum Note (Signed)
Addended by: Monica BectonHEKKEKANDAM, Zayla Agar J on: 02/17/2018 10:11 PM   Modules accepted: Orders

## 2018-02-17 NOTE — Assessment & Plan Note (Signed)
Resolution of left S1 radiculitis, she did have axial symptoms likely related to spinal stenosis. The burst of prednisone and the meloxicam have resolved her symptoms, continue rehab exercises indefinitely.

## 2018-02-17 NOTE — Telephone Encounter (Signed)
Patient advised.

## 2018-02-17 NOTE — Patient Instructions (Signed)
[address], [place], [place] [address]  Phone:[phone]

## 2018-02-17 NOTE — Progress Notes (Addendum)
Subjective:    CC: Left leg swelling  HPI: Meredith Mcgee is a pleasant 67 year old female, I'm seeing her back for some new-onset left leg swelling, we initially treated her for left S1 radiculitis, this resolved completely with rehabilitation exercises and prednisone. She noted the swelling sometime at the end of last month, denies any history of blood clots, personal or family, no prolonged periods of immobilization, she's not on any medications that would predispose to thrombo-embolism.  No chest pain, shortness of breath.  I reviewed the past medical history, family history, social history, surgical history, and allergies today and no changes were needed.  Please see the problem list section below in epic for further details.  Past Medical History: No past medical history on file. Past Surgical History: No past surgical history on file. Social History: Social History   Socioeconomic History  . Marital status: Married    Spouse name: Not on file  . Number of children: Not on file  . Years of education: Not on file  . Highest education level: Not on file  Occupational History  . Not on file  Social Needs  . Financial resource strain: Not on file  . Food insecurity:    Worry: Not on file    Inability: Not on file  . Transportation needs:    Medical: Not on file    Non-medical: Not on file  Tobacco Use  . Smoking status: Never Smoker  . Smokeless tobacco: Never Used  Substance and Sexual Activity  . Alcohol use: Not on file  . Drug use: Not on file  . Sexual activity: Not on file  Lifestyle  . Physical activity:    Days per week: Not on file    Minutes per session: Not on file  . Stress: Not on file  Relationships  . Social connections:    Talks on phone: Not on file    Gets together: Not on file    Attends religious service: Not on file    Active member of club or organization: Not on file    Attends meetings of clubs or organizations: Not on file    Relationship status:  Not on file  Other Topics Concern  . Not on file  Social History Narrative  . Not on file   Family History: No family history on file. Allergies: Allergies  Allergen Reactions  . Gluten Meal   . Oxycodone   . Sulfa Antibiotics    Medications: See med rec.  Review of Systems: No fevers, chills, night sweats, weight loss, chest pain, or shortness of breath.   Objective:    General: Well Developed, well nourished, and in no acute distress.  Neuro: Alert and oriented x3, extra-ocular muscles intact, sensation grossly intact.  HEENT: Normocephalic, atraumatic, pupils equal round reactive to light, neck supple, no masses, no lymphadenopathy, thyroid nonpalpable.  Skin: Warm and dry, no rashes. Cardiac: Regular rate and rhythm, no murmurs rubs or gallops, no lower extremity edema.  Respiratory: Clear to auscultation bilaterally. Not using accessory muscles, speaking in full sentences. Left leg: Swollen, positive Homans sign, 3+ pitting edema, right leg with 2+ pitting edema. Significant varicosities on both legs. No palpable popliteal cyst.  Left leg was strapped with compressive dressing.  Impression and Recommendations:    Leg DVT (deep venous thromboembolism), acute, left (HCC) Stat DVT ultrasound at [facility]. Compression hose, we are ruling out a DVT but I do think this is likely from venous insufficiency. She will need to  keep her follow-up with Dr. Julius BowelsPollock however I would like to see her back in 2 weeks. Left leg was strapped with compressive dressing.  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  Discussed with patient on the phone, [facility] [place], patient will pick it up now.  She will also get thigh high compression hose to prevent post-thrombotic/phlebitic syndrome.  I would anticipate 6 months of treatment but I will defer to her PCP.  Advised to follow up with him sometime this week or next.  Left lumbar  radiculitis Resolution of left S1 radiculitis, she did have axial symptoms likely related to spinal stenosis. The burst of prednisone and the meloxicam have resolved her symptoms, continue rehab exercises indefinitely. ___________________________________________ Ihor Austinhomas J. Benjamin Stainhekkekandam, M.D., ABFM., CAQSM. Primary Care and Sports Medicine [place] [facility]  Adjunct Instructor of Family Medicine  [employer]

## 2018-02-18 NOTE — Addendum Note (Signed)
Addended by: Baird KayUGLAS, Florenda Watt M on: 02/18/2018 08:57 AM   Modules accepted: Orders

## 2018-02-24 ENCOUNTER — Ambulatory Visit: Payer: Medicare Other | Admitting: Sports Medicine

## 2018-03-03 ENCOUNTER — Encounter: Payer: Self-pay | Admitting: Sports Medicine

## 2018-03-03 ENCOUNTER — Ambulatory Visit: Payer: Medicare Other | Admitting: Sports Medicine

## 2018-03-03 DIAGNOSIS — I82402 Acute embolism and thrombosis of unspecified deep veins of left lower extremity: Secondary | ICD-10-CM | POA: Diagnosis not present

## 2018-03-03 NOTE — Progress Notes (Signed)
  Subjective:    CC: Follow-up  HPI: Meredith Mcgee is a pleasant 67 year old female, at the last visit she had some leg swelling, ultimately we diagnosed her with an extensive DVT, started Eliquis, she has bilateral thigh-high compression hose, feels pretty good, no pain.  Her PCP is going to take over her anticoagulation.  I reviewed the past medical history, family history, social history, surgical history, and allergies today and no changes were needed.  Please see the problem list section below in epic for further details.  Past Medical History: No past medical history on file. Past Surgical History: No past surgical history on file. Social History: Social History   Socioeconomic History  . Marital status: Married    Spouse name: Not on file  . Number of children: Not on file  . Years of education: Not on file  . Highest education level: Not on file  Occupational History  . Not on file  Social Needs  . Financial resource strain: Not on file  . Food insecurity:    Worry: Not on file    Inability: Not on file  . Transportation needs:    Medical: Not on file    Non-medical: Not on file  Tobacco Use  . Smoking status: Never Smoker  . Smokeless tobacco: Never Used  Substance and Sexual Activity  . Alcohol use: Not on file  . Drug use: Not on file  . Sexual activity: Not on file  Lifestyle  . Physical activity:    Days per week: Not on file    Minutes per session: Not on file  . Stress: Not on file  Relationships  . Social connections:    Talks on phone: Not on file    Gets together: Not on file    Attends religious service: Not on file    Active member of club or organization: Not on file    Attends meetings of clubs or organizations: Not on file    Relationship status: Not on file  Other Topics Concern  . Not on file  Social History Narrative  . Not on file   Family History: No family history on file. Allergies: Allergies  Allergen Reactions  . Gluten Meal   .  Oxycodone   . Sulfa Antibiotics    Medications: See med rec.  Review of Systems: No fevers, chills, night sweats, weight loss, chest pain, or shortness of breath.   Objective:    General: Well Developed, well nourished, and in no acute distress.  Neuro: Alert and oriented x3, extra-ocular muscles intact, sensation grossly intact.  HEENT: Normocephalic, atraumatic, pupils equal round reactive to light, neck supple, no masses, no lymphadenopathy, thyroid nonpalpable.  Skin: Warm and dry, no rashes. Cardiac: Regular rate and rhythm, no murmurs rubs or gallops, no lower extremity edema.  Respiratory: Clear to auscultation bilaterally. Not using accessory muscles, speaking in full sentences.  Impression and Recommendations:    Leg DVT (deep venous thromboembolism), acute, left (HCC) Doing well, thigh-high bilateral compression hose to prevent post thrombotic/phlebitic syndrome. On Eliquis, handing this off to her PCP from now on. I sincerely appreciate his help. Return as needed. ___________________________________________ Ihor Austinhomas J. Benjamin Stainhekkekandam, M.D., ABFM., CAQSM. Primary Care and Sports Medicine [place] [facility]  Adjunct Instructor of Family Medicine  [employer]

## 2018-03-03 NOTE — Assessment & Plan Note (Signed)
Doing well, thigh-high bilateral compression hose to prevent post thrombotic/phlebitic syndrome. On Eliquis, handing this off to her PCP from now on. I sincerely appreciate his help. Return as needed.

## 2018-10-28 ENCOUNTER — Other Ambulatory Visit: Payer: Self-pay

## 2018-10-28 NOTE — Patient Outreach (Signed)
[facility] Care Management  10/28/2018  Gwynn Burlylice A Ferger [birth year] [id number]   Medication Adherence call to Mrs. Sinclair GroomsAlice Sayavong spoke with patient she is due on Lisinopril 20 mg she still has medication but she is coming up due for more but she will order it herself patient refuse the help for us calling it in for her. Mrs. Valarie MerinoChastain is showing past due under [facility].   Lillia AbedAna Ollison-Moran CPhT Pharmacy Technician [facility] Care Management Direct Dial [phone]  Fax [phone] [email or web address]

## 2019-06-09 ENCOUNTER — Ambulatory Visit (INDEPENDENT_AMBULATORY_CARE_PROVIDER_SITE_OTHER): Payer: Medicare Other | Admitting: Sports Medicine

## 2019-06-09 ENCOUNTER — Encounter: Payer: Self-pay | Admitting: Sports Medicine

## 2019-06-09 ENCOUNTER — Other Ambulatory Visit: Payer: Self-pay

## 2019-06-09 DIAGNOSIS — M47818 Spondylosis without myelopathy or radiculopathy, sacral and sacrococcygeal region: Secondary | ICD-10-CM | POA: Diagnosis not present

## 2019-06-09 DIAGNOSIS — M461 Sacroiliitis, not elsewhere classified: Secondary | ICD-10-CM

## 2019-06-09 NOTE — Progress Notes (Signed)
Subjective:    CC: Low back pain  HPI: This is a pleasant 68 year old female, we have treated her for left sacroiliac joint dysfunction with an injection back in 2016, she has done well until the past couple of months, now having a recurrence of pain in the left SI joint with radiation to the buttock and posterior thigh, moderate, persistent, localized.  Worse with weightbearing, no bowel or bladder dysfunction, saddle numbness, no constitutional symptoms.  No trauma.  I reviewed the past medical history, family history, social history, surgical history, and allergies today and no changes were needed.  Please see the problem list section below in epic for further details.  Past Medical History: No past medical history on file. Past Surgical History: No past surgical history on file. Social History: Social History   Socioeconomic History  . Marital status: Married    Spouse name: Not on file  . Number of children: Not on file  . Years of education: Not on file  . Highest education level: Not on file  Occupational History  . Not on file  Social Needs  . Financial resource strain: Not on file  . Food insecurity    Worry: Not on file    Inability: Not on file  . Transportation needs    Medical: Not on file    Non-medical: Not on file  Tobacco Use  . Smoking status: Never Smoker  . Smokeless tobacco: Never Used  Substance and Sexual Activity  . Alcohol use: Not on file  . Drug use: Not on file  . Sexual activity: Not on file  Lifestyle  . Physical activity    Days per week: Not on file    Minutes per session: Not on file  . Stress: Not on file  Relationships  . Social Herbalist on phone: Not on file    Gets together: Not on file    Attends religious service: Not on file    Active member of club or organization: Not on file    Attends meetings of clubs or organizations: Not on file    Relationship status: Not on file  Other Topics Concern  . Not on file   Social History Narrative  . Not on file   Family History: No family history on file. Allergies: Allergies  Allergen Reactions  . Gluten Meal   . Oxycodone   . Sulfa Antibiotics    Medications: See med rec.  Review of Systems: No fevers, chills, night sweats, weight loss, chest pain, or shortness of breath.   Objective:    General: Well Developed, well nourished, and in no acute distress.  Neuro: Alert and oriented x3, extra-ocular muscles intact, sensation grossly intact.  HEENT: Normocephalic, atraumatic, pupils equal round reactive to light, neck supple, no masses, no lymphadenopathy, thyroid nonpalpable.  Skin: Warm and dry, no rashes. Cardiac: Regular rate and rhythm, no murmurs rubs or gallops, no lower extremity edema.  Respiratory: Clear to auscultation bilaterally. Not using accessory muscles, speaking in full sentences. Back Exam:  Inspection: Unremarkable  Motion: Flexion 45 deg, Extension 45 deg, Side Bending to 45 deg bilaterally,  Rotation to 45 deg bilaterally  SLR laying: Negative  XSLR laying: Negative  Palpable tenderness: Left sacroiliac joint. FABER: negative. Sensory change: Gross sensation intact to all lumbar and sacral dermatomes.  Reflexes: 2+ at both patellar tendons, 2+ at achilles tendons, Babinski's downgoing.  Strength at foot  Plantar-flexion: 5/5 Dorsi-flexion: 5/5 Eversion: 5/5 Inversion: 5/5  Leg strength  Quad: 5/5 Hamstring: 5/5 Hip flexor: 5/5 Hip abductors: 5/5  Gait unremarkable.  Procedure: Real-time Ultrasound Guided injection of the left sacroiliac joint Device: GE Logiq E  Verbal informed consent obtained.  Time-out conducted.  Noted no overlying erythema, induration, or other signs of local infection.  Skin prepped in a sterile fashion.  Local anesthesia: Topical Ethyl chloride.  With sterile technique and under real time ultrasound guidance:  22-gauge spinal needle advanced into the SI joint, I did injected 1 cc Kenalog 40, 2  cc lidocaine, 2 cc bupivacaine. Completed without difficulty  Pain immediately resolved suggesting accurate placement of the medication.  Advised to call if fevers/chills, erythema, induration, drainage, or persistent bleeding.  Images permanently stored and available for review in the ultrasound unit.  Impression: Technically successful ultrasound guided injection.  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 Recurrence of pain over the left SI joint, reinjected today. We discussed the other options including SI joint radiofrequency ablation and SI joint fusion. Virtual visit in 1 month to reevaluate after injection.   ___________________________________________ Ihor Austinhomas J. Benjamin Stainhekkekandam, M.D., ABFM., CAQSM. Primary Care and Sports Medicine [place] [facility]  Adjunct Professor of Family Medicine  [employer]

## 2019-06-09 NOTE — Assessment & Plan Note (Signed)
Last SI joint was in 2016. Recurrence of pain over the left SI joint, reinjected today. We discussed the other options including SI joint radiofrequency ablation and SI joint fusion. Virtual visit in 1 month to reevaluate after injection.

## 2019-06-10 ENCOUNTER — Encounter: Payer: Self-pay | Admitting: Sports Medicine

## 2019-07-07 ENCOUNTER — Encounter: Payer: Self-pay | Admitting: Sports Medicine

## 2019-07-07 ENCOUNTER — Telehealth (INDEPENDENT_AMBULATORY_CARE_PROVIDER_SITE_OTHER): Payer: Medicare Other | Admitting: Sports Medicine

## 2019-07-07 DIAGNOSIS — M47818 Spondylosis without myelopathy or radiculopathy, sacral and sacrococcygeal region: Secondary | ICD-10-CM

## 2019-07-07 DIAGNOSIS — M461 Sacroiliitis, not elsewhere classified: Secondary | ICD-10-CM

## 2019-07-07 DIAGNOSIS — M7061 Trochanteric bursitis, right hip: Secondary | ICD-10-CM

## 2019-07-07 NOTE — Assessment & Plan Note (Signed)
We did an SI joint injection in 2016 and again in July 2020. She had an 80% improvement in pain. She will continue the SI joint rehab exercises, I am also going to send her a link for an SI joint stabilization belt. If all of this fails we can consider repeat injection versus radiofrequency ablation.

## 2019-07-07 NOTE — Assessment & Plan Note (Signed)
Starting to have a recurrence of pain, we did an injection back in June 2018. I have advised her to restart hip abductor rehabilitation exercises before we consider intervention.

## 2019-07-07 NOTE — Progress Notes (Signed)
Virtual Visit via WebEx/MyChart   I connected with  Meredith Mcgee  on 07/07/19 via WebEx/MyChart/Doximity Video and verified that I am speaking with the correct person using two identifiers.   I discussed the limitations, risks, security and privacy concerns of performing an evaluation and management service by WebEx/MyChart/Doximity Video, including the higher likelihood of inaccurate diagnosis and treatment, and the availability of in person appointments.  We also discussed the likely need of an additional face to face encounter for complete and high quality delivery of care.  I also discussed with the patient that there may be a patient responsible charge related to this service. The patient expressed understanding and wishes to proceed.  Provider location is either at home or medical facility. Patient location is at their home, different from provider location. People involved in care of the patient during this telehealth encounter were myself, my nurse/medical assistant, and my front office/scheduling team member.  Subjective:    CC: Follow-up  HPI: We did an SI joint injection on Meredith Mcgee a month ago, she had an 80% improvement in pain, she is also started to have recurrence of her right trochanteric bursitis symptoms.  I reviewed the past medical history, family history, social history, surgical history, and allergies today and no changes were needed.  Please see the problem list section below in epic for further details.  Past Medical History: No past medical history on file. Past Surgical History: No past surgical history on file. Social History: Social History   Socioeconomic History  . Marital status: Married    Spouse name: Not on file  . Number of children: Not on file  . Years of education: Not on file  . Highest education level: Not on file  Occupational History  . Not on file  Social Needs  . Financial resource strain: Not on file  . Food insecurity    Worry:  Not on file    Inability: Not on file  . Transportation needs    Medical: Not on file    Non-medical: Not on file  Tobacco Use  . Smoking status: Never Smoker  . Smokeless tobacco: Never Used  Substance and Sexual Activity  . Alcohol use: Not on file  . Drug use: Not on file  . Sexual activity: Not on file  Lifestyle  . Physical activity    Days per week: Not on file    Minutes per session: Not on file  . Stress: Not on file  Relationships  . Social Musicianconnections    Talks on phone: Not on file    Gets together: Not on file    Attends religious service: Not on file    Active member of club or organization: Not on file    Attends meetings of clubs or organizations: Not on file    Relationship status: Not on file  Other Topics Concern  . Not on file  Social History Narrative  . Not on file   Family History: No family history on file. Allergies: Allergies  Allergen Reactions  . Gluten Meal   . Oxycodone   . Sulfa Antibiotics    Medications: See med rec.  Review of Systems: No fevers, chills, night sweats, weight loss, chest pain, or shortness of breath.   Objective:    General: Speaking full sentences, no audible heavy breathing.  Sounds alert and appropriately interactive.  Appears well.  Face symmetric.  Extraocular movements intact.  Pupils equal and round.  No nasal flaring or accessory  muscle use visualized.  No other physical exam performed due to the non-physical nature of this visit.  Impression and Recommendations:    Greater trochanteric bursitis, right Starting to have a recurrence of pain, we did an injection back in June 2018. I have advised her to restart hip abductor rehabilitation exercises before we consider intervention.  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 She had an 80% improvement in pain. She will continue the SI joint rehab exercises, I am also going to send her a link  for an SI joint stabilization belt. If all of this fails we can consider repeat injection versus radiofrequency ablation.  I discussed the above assessment and treatment plan with the patient. The patient was provided an opportunity to ask questions and all were answered. The patient agreed with the plan and demonstrated an understanding of the instructions.   The patient was advised to call back or seek an in-person evaluation if the symptoms worsen or if the condition fails to improve as anticipated.   I provided 25 minutes of non-face-to-face time during this encounter, 15 minutes of additional time was needed to gather information, review chart, records, communicate/coordinate with staff remotely, troubleshooting the multiple errors that we get every time when trying to do video calls through the electronic medical record, WebEx, and Doximity, restart the encounter multiple times due to instability of the software, as well as complete documentation.   ___________________________________________ Gwen Her. Dianah Field, M.D., ABFM., CAQSM. Primary Care and Sports Medicine [facility]  Adjunct Professor of [employer]

## 2019-09-17 ENCOUNTER — Telehealth: Payer: Self-pay

## 2019-09-17 ENCOUNTER — Other Ambulatory Visit: Payer: Self-pay

## 2019-09-17 ENCOUNTER — Ambulatory Visit (INDEPENDENT_AMBULATORY_CARE_PROVIDER_SITE_OTHER): Payer: Medicare Other | Admitting: Sports Medicine

## 2019-09-17 ENCOUNTER — Ambulatory Visit: Payer: Medicare Other

## 2019-09-17 DIAGNOSIS — I82441 Acute embolism and thrombosis of right tibial vein: Secondary | ICD-10-CM | POA: Diagnosis not present

## 2019-09-17 DIAGNOSIS — M79604 Pain in right leg: Secondary | ICD-10-CM

## 2019-09-17 DIAGNOSIS — Z86718 Personal history of other venous thrombosis and embolism: Secondary | ICD-10-CM

## 2019-09-17 MED ORDER — APIXABAN 5 MG PO TABS: ORAL_TABLET | ORAL | 3 refills | Status: AC

## 2019-09-17 NOTE — Progress Notes (Signed)
Subjective:    CC: Right leg swelling  HPI: Meredith Mcgee is a pleasant 68 year old female with a history of a left lower extremity DVT a year ago, unfortunately she has noted increased swelling in her right leg, we obtained a stat ultrasound prior to the visit which did show an extensive DVT from the right proximal to mid femoral vein through the lesser saphenous vein, tibial veins and peroneal veins.  I reviewed the past medical history, family history, social history, surgical history, and allergies today and no changes were needed.  Please see the problem list section below in epic for further details.  Past Medical History: No past medical history on file. Past Surgical History: No past surgical history on file. Social History: Social History   Socioeconomic History  . Marital status: Married    Spouse name: Not on file  . Number of children: Not on file  . Years of education: Not on file  . Highest education level: Not on file  Occupational History  . Not on file  Social Needs  . Financial resource strain: Not on file  . Food insecurity    Worry: Not on file    Inability: Not on file  . Transportation needs    Medical: Not on file    Non-medical: Not on file  Tobacco Use  . Smoking status: Never Smoker  . Smokeless tobacco: Never Used  Substance and Sexual Activity  . Alcohol use: Not on file  . Drug use: Not on file  . Sexual activity: Not on file  Lifestyle  . Physical activity    Days per week: Not on file    Minutes per session: Not on file  . Stress: Not on file  Relationships  . Social Musician on phone: Not on file    Gets together: Not on file    Attends religious service: Not on file    Active member of club or organization: Not on file    Attends meetings of clubs or organizations: Not on file    Relationship status: Not on file  Other Topics Concern  . Not on file  Social History Narrative  . Not on file   Family History: No family  history on file. Allergies: Allergies  Allergen Reactions  . Gluten Meal   . Oxycodone   . Sulfa Antibiotics    Medications: See med rec.  Review of Systems: No fevers, chills, night sweats, weight loss, chest pain, or shortness of breath.   Objective:    General: Well Developed, well nourished, and in no acute distress.  Neuro: Alert and oriented x3, extra-ocular muscles intact, sensation grossly intact.  HEENT: Normocephalic, atraumatic, pupils equal round reactive to light, neck supple, no masses, no lymphadenopathy, thyroid nonpalpable.  Skin: Warm and dry, no rashes. Cardiac: Regular rate and rhythm, no murmurs rubs or gallops, no lower extremity edema.  Respiratory: Clear to auscultation bilaterally. Not using accessory muscles, speaking in full sentences. Right leg: Minimally swollen, positive Homans' sign.  Neurovascularly intact distally.  Ultrasound shows extensive right lower extremity DVT.  Impression and Recommendations:    Acute DVT of right tibial vein (HCC) Acute right DVT from the right proximal to mid femoral vein down to the lesser saphenous vein, posterior tibial and peroneal veins. 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 She has had protein C, protein S, plasminogen, Antithrombin III testing. I am going to add the rest of the hypercoagulable panel. Prothrombin gene  mutation, factor V Leyden, homocysteine, antiphospholipid antibody testing. Continue lower extremity compression hose to prevent post thrombotic phlebitic syndrome. Referral back to PCP for further management.   ___________________________________________ Gwen Her. Dianah Field, M.D., ABFM., CAQSM. Primary Care and Sports Medicine [facility]  Adjunct Professor of [employer]

## 2019-09-17 NOTE — Telephone Encounter (Signed)
Patient has HX of DVT.   Complaints of right leg aching and pressure. Patient on Dr Mcneil Sober schedule to be seen today. Per Dr T, order u/s to r/o DVT STAT   Order placed

## 2019-09-17 NOTE — Assessment & Plan Note (Signed)
Acute right DVT from the right proximal to mid femoral vein down to the lesser saphenous vein, posterior tibial and peroneal veins. 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 She has had protein C, protein S, plasminogen, Antithrombin III testing. I am going to add the rest of the hypercoagulable panel. Prothrombin gene mutation, factor V Leyden, homocysteine, antiphospholipid antibody testing. Continue lower extremity compression hose to prevent post thrombotic phlebitic syndrome. Referral back to PCP for further management.

## 2019-09-20 ENCOUNTER — Encounter: Payer: Self-pay | Admitting: Sports Medicine

## 2019-09-25 IMAGING — US US EXTREM LOW VENOUS*L*
1 series · 13 of 24 positions shown · non-contrast
Comparison: None.

CLINICAL DATA: Left leg swelling for 2 weeks



[Series 1: us extrem low venous*left* · 0.08mm/px · 13 of 24 slices shown]
[im 1/24]
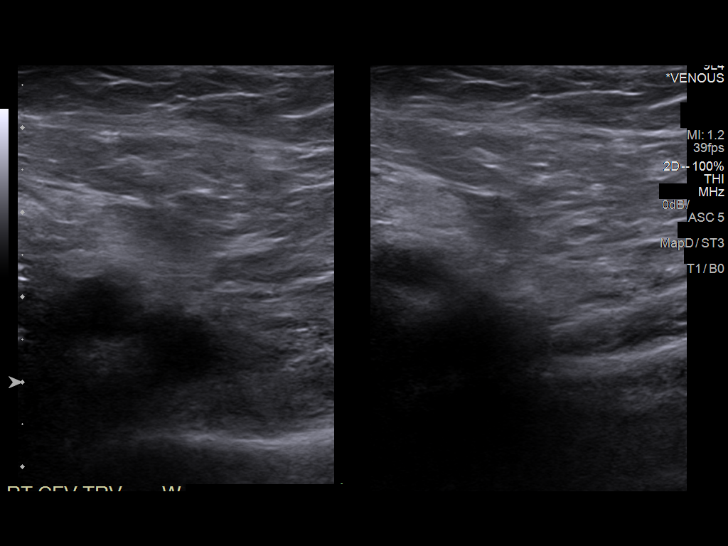
[im 3/24]
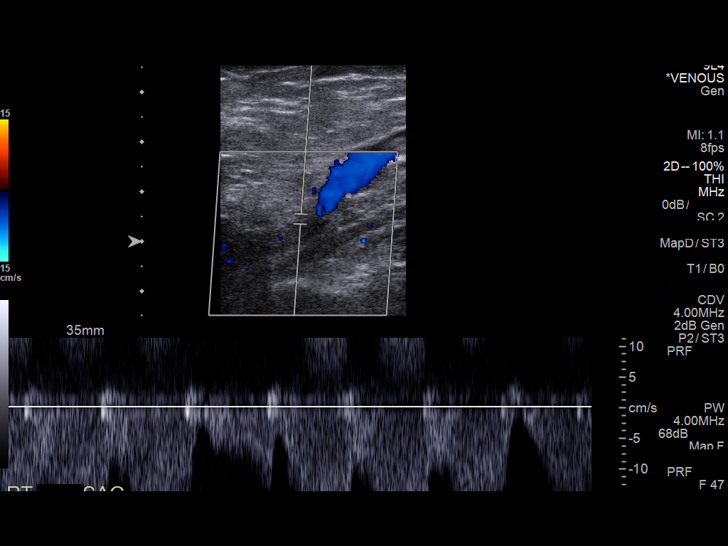
[im 5/24]
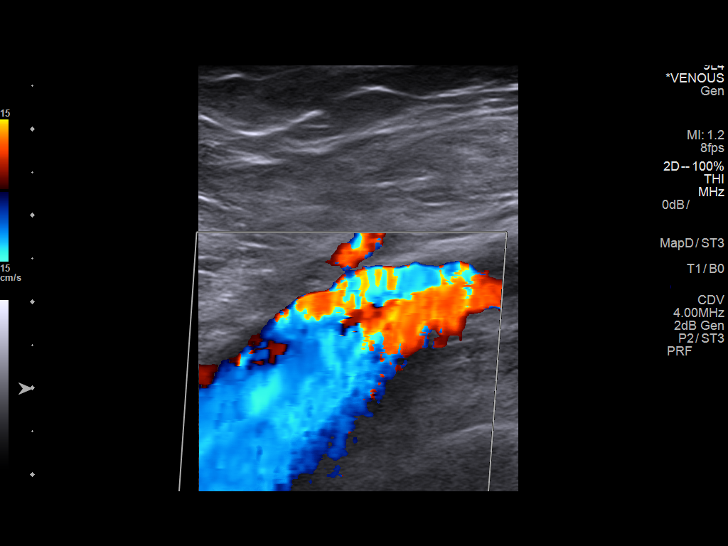
[im 7/24]
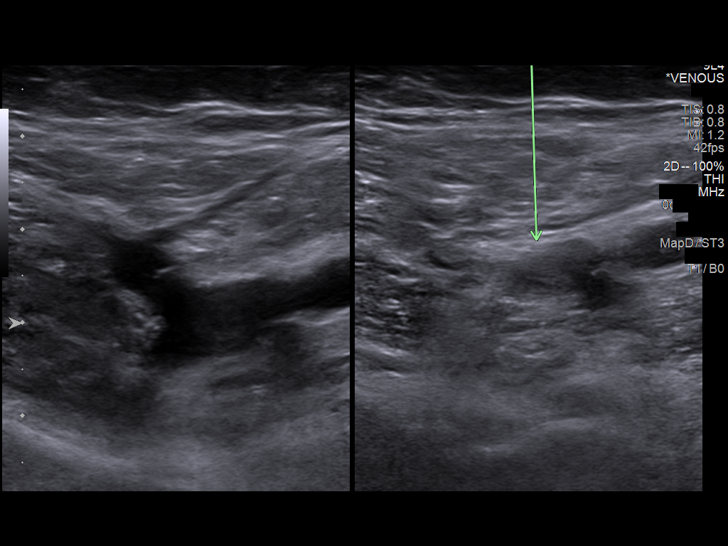
[im 9/24]
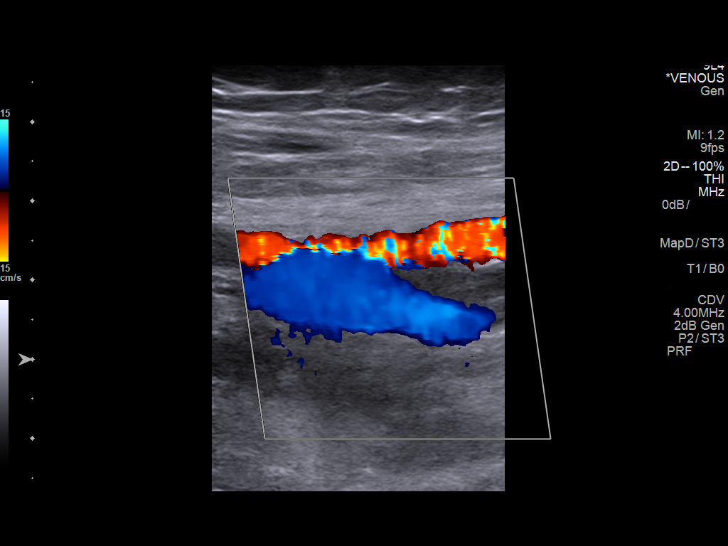
[im 11/24]
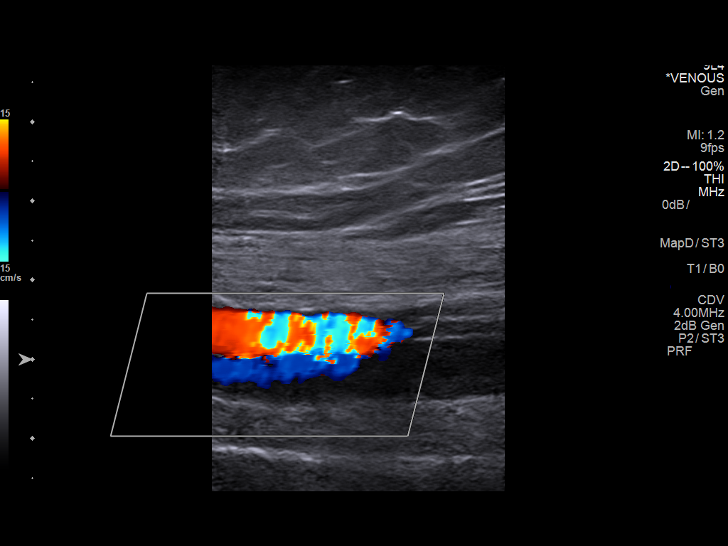
[im 13/24]
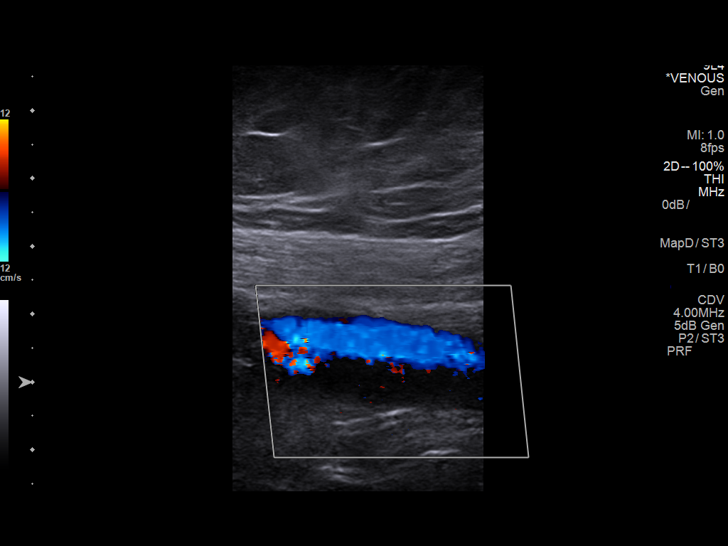
[im 14/24]
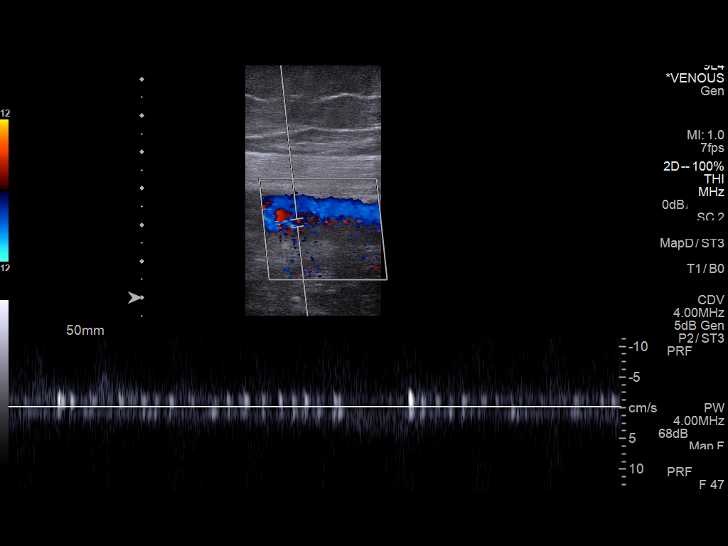
[im 16/24]
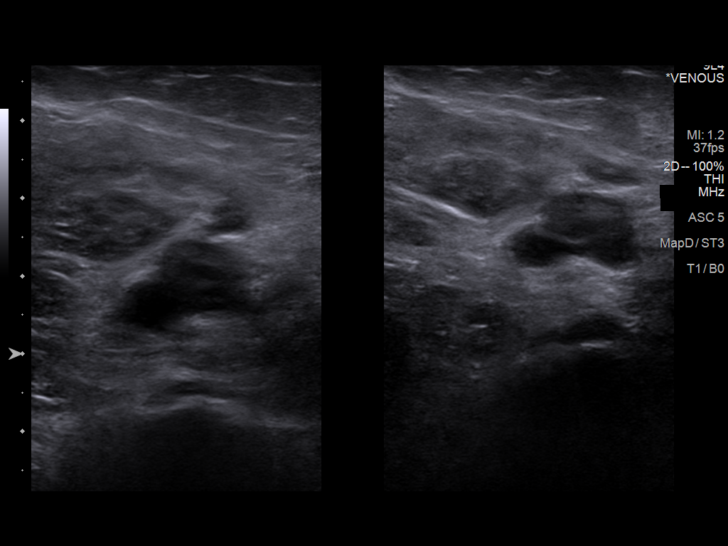
[im 18/24]
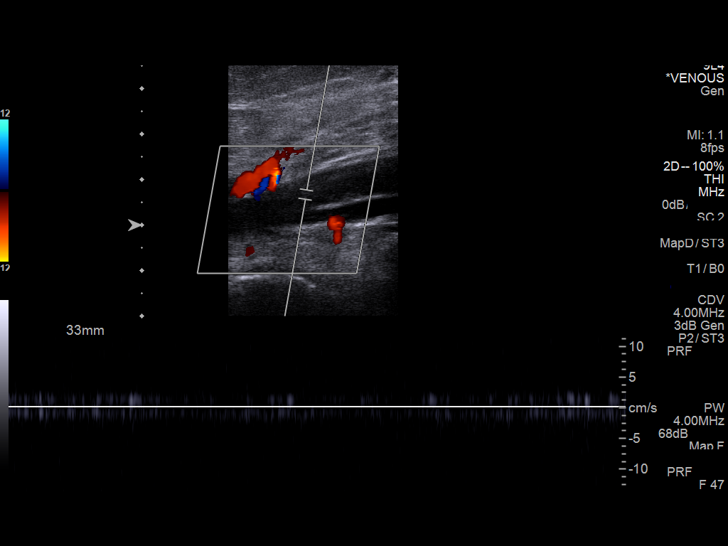
[im 20/24]
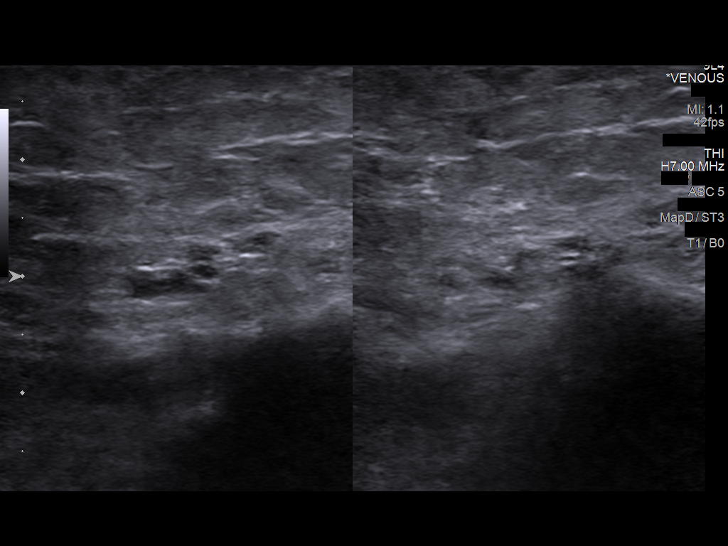
[im 22/24]
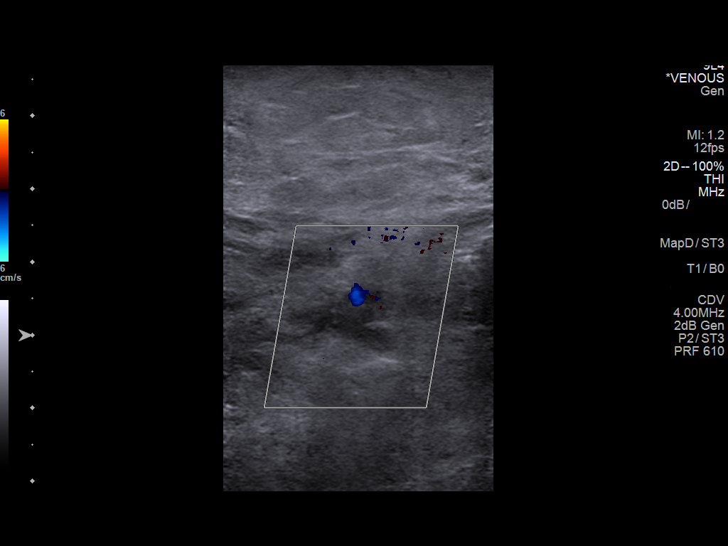
[im 24/24]
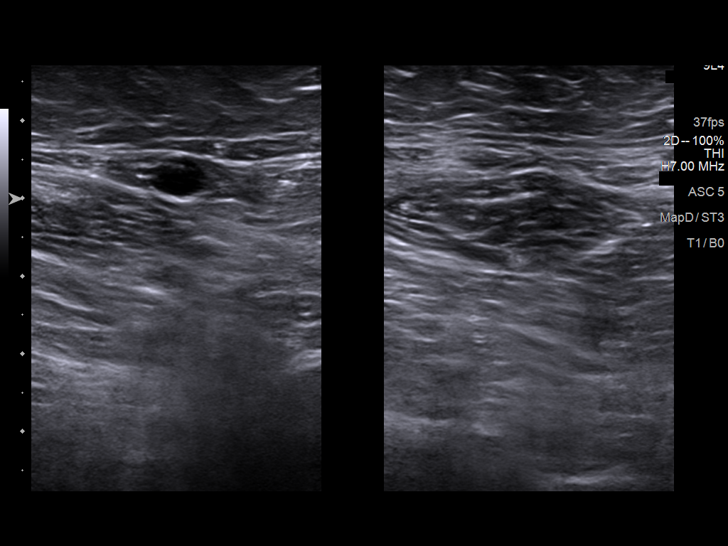

[13 of 24 positions shown; findings below may reference images not displayed]

FINDINGS: Contralateral Common Femoral Vein: Respiratory phasicity is normal
and symmetric with the symptomatic side. No evidence of thrombus.
Normal compressibility.

Common Femoral Vein: No evidence of thrombus. Normal
compressibility, respiratory phasicity and response to augmentation.

Saphenofemoral Junction: Thrombus is noted with decreased
compressibility.

Profunda Femoral Vein: Thrombus is noted with decreased
compressibility.

Femoral Vein: Thrombus is noted with decreased compressibility.

Popliteal Vein: Thrombus is noted with decreased compressibility.

Calf Veins: Thrombus is noted with decreased compressibility.

Superficial Great Saphenous Vein: No evidence of thrombus. Normal
compressibility.

Venous Reflux:  None.

Other Findings:  None.
IMPRESSION: Extensive left lower extremity deep venous thrombosis. This extends
from the calf to the level of the saphenofemoral junction.

## 2019-10-20 LAB — ANTIPHOSPHOLIPID SYNDROME DIAGNOSTIC PANEL
Anticardiolipin IgA: <11 [APL'U] (ref <=11)
Anticardiolipin IgG: <14 [GPL'U] (ref <=14)
Anticardiolipin IgM: <12 [MPL'U] (ref <=12)
Beta-2 Glyco 1 IgA: <9 SAU (ref <=20)
Beta-2 Glyco 1 IgM: <9 SMU (ref <=20)
Beta-2 Glyco I IgG: <9 SGU (ref <=20)
PTT-LA Screen: 39 s (ref <=40)
dRVVT: 32 s (ref <=45)

## 2019-10-20 LAB — APTT: aPTT: 26 s (ref 23–32)

## 2019-10-20 LAB — PROTIME-INR
INR: 1
Prothrombin Time: 10.5 s (ref 9.0–11.5)

## 2019-10-20 LAB — HOMOCYSTEINE: Homocysteine: 13.9 umol/L — ABNORMAL HIGH (ref <10.4)

## 2019-10-20 LAB — PROTHROMBIN GENE MUTATION

## 2019-10-20 LAB — FACTOR 5 ASSAY: COAG FACTOR V ACTIVITY: 125 % normal (ref 65–150)

## 2019-11-30 ENCOUNTER — Other Ambulatory Visit: Payer: Self-pay | Admitting: Sports Medicine

## 2019-11-30 DIAGNOSIS — I82441 Acute embolism and thrombosis of right tibial vein: Secondary | ICD-10-CM

## 2019-12-01 DIAGNOSIS — Z780 Asymptomatic menopausal state: Secondary | ICD-10-CM | POA: Diagnosis not present

## 2019-12-01 DIAGNOSIS — M810 Age-related osteoporosis without current pathological fracture: Secondary | ICD-10-CM | POA: Diagnosis not present

## 2019-12-10 DIAGNOSIS — D539 Nutritional anemia, unspecified: Secondary | ICD-10-CM | POA: Diagnosis not present

## 2020-01-13 DIAGNOSIS — D539 Nutritional anemia, unspecified: Secondary | ICD-10-CM | POA: Diagnosis not present

## 2020-01-18 DIAGNOSIS — Z79899 Other long term (current) drug therapy: Secondary | ICD-10-CM | POA: Diagnosis not present

## 2020-01-18 DIAGNOSIS — K900 Celiac disease: Secondary | ICD-10-CM | POA: Diagnosis not present

## 2020-01-18 DIAGNOSIS — I10 Essential (primary) hypertension: Secondary | ICD-10-CM | POA: Diagnosis not present

## 2020-01-18 DIAGNOSIS — D509 Iron deficiency anemia, unspecified: Secondary | ICD-10-CM | POA: Diagnosis not present

## 2020-01-18 DIAGNOSIS — I82411 Acute embolism and thrombosis of right femoral vein: Secondary | ICD-10-CM | POA: Diagnosis not present

## 2020-01-18 DIAGNOSIS — E669 Obesity, unspecified: Secondary | ICD-10-CM | POA: Diagnosis not present

## 2020-01-18 DIAGNOSIS — N3281 Overactive bladder: Secondary | ICD-10-CM | POA: Diagnosis not present

## 2020-01-18 DIAGNOSIS — K219 Gastro-esophageal reflux disease without esophagitis: Secondary | ICD-10-CM | POA: Diagnosis not present

## 2020-01-18 DIAGNOSIS — C50912 Malignant neoplasm of unspecified site of left female breast: Secondary | ICD-10-CM | POA: Diagnosis not present

## 2020-02-24 ENCOUNTER — Telehealth: Payer: Self-pay

## 2020-02-24 NOTE — Telephone Encounter (Signed)
It is none of those, homocysteine is a commonly ordered test to evaluate for hypercoagulable states, she does not have any of the listed diagnoses.  Her homocystine levels did end up elevated and this is the cause of her hypercoagulability and DVTs, no change in diagnosis code.

## 2020-02-24 NOTE — Telephone Encounter (Signed)
Ok, added "no new information".

## 2020-02-24 NOTE — Telephone Encounter (Signed)
[facility] billing trailers for Homocysteine.    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

## 2020-04-03 DIAGNOSIS — L84 Corns and callosities: Secondary | ICD-10-CM | POA: Diagnosis not present

## 2020-05-29 DIAGNOSIS — R202 Paresthesia of skin: Secondary | ICD-10-CM | POA: Diagnosis not present

## 2020-05-29 DIAGNOSIS — K219 Gastro-esophageal reflux disease without esophagitis: Secondary | ICD-10-CM | POA: Diagnosis not present

## 2020-05-29 DIAGNOSIS — N3281 Overactive bladder: Secondary | ICD-10-CM | POA: Diagnosis not present

## 2020-05-29 DIAGNOSIS — I82411 Acute embolism and thrombosis of right femoral vein: Secondary | ICD-10-CM | POA: Diagnosis not present

## 2020-05-29 DIAGNOSIS — Z1211 Encounter for screening for malignant neoplasm of colon: Secondary | ICD-10-CM | POA: Diagnosis not present

## 2020-05-29 DIAGNOSIS — E611 Iron deficiency: Secondary | ICD-10-CM | POA: Diagnosis not present

## 2020-05-29 DIAGNOSIS — E669 Obesity, unspecified: Secondary | ICD-10-CM | POA: Diagnosis not present

## 2020-05-29 DIAGNOSIS — I10 Essential (primary) hypertension: Secondary | ICD-10-CM | POA: Diagnosis not present

## 2020-05-29 DIAGNOSIS — K900 Celiac disease: Secondary | ICD-10-CM | POA: Diagnosis not present

## 2020-07-06 DIAGNOSIS — I10 Essential (primary) hypertension: Secondary | ICD-10-CM | POA: Diagnosis not present

## 2020-07-06 DIAGNOSIS — Z853 Personal history of malignant neoplasm of breast: Secondary | ICD-10-CM | POA: Diagnosis not present

## 2020-07-06 DIAGNOSIS — K900 Celiac disease: Secondary | ICD-10-CM | POA: Diagnosis not present

## 2020-07-06 DIAGNOSIS — Z9189 Other specified personal risk factors, not elsewhere classified: Secondary | ICD-10-CM | POA: Diagnosis not present

## 2020-07-06 DIAGNOSIS — K219 Gastro-esophageal reflux disease without esophagitis: Secondary | ICD-10-CM | POA: Diagnosis not present

## 2020-07-06 DIAGNOSIS — I82411 Acute embolism and thrombosis of right femoral vein: Secondary | ICD-10-CM | POA: Diagnosis not present

## 2020-07-06 DIAGNOSIS — E669 Obesity, unspecified: Secondary | ICD-10-CM | POA: Diagnosis not present

## 2020-08-08 DIAGNOSIS — D124 Benign neoplasm of descending colon: Secondary | ICD-10-CM | POA: Diagnosis not present

## 2020-08-08 DIAGNOSIS — Z86010 Personal history of colonic polyps: Secondary | ICD-10-CM | POA: Diagnosis not present

## 2020-08-08 DIAGNOSIS — Z1211 Encounter for screening for malignant neoplasm of colon: Secondary | ICD-10-CM | POA: Diagnosis not present

## 2020-08-08 DIAGNOSIS — D120 Benign neoplasm of cecum: Secondary | ICD-10-CM | POA: Diagnosis not present

## 2020-08-08 DIAGNOSIS — D122 Benign neoplasm of ascending colon: Secondary | ICD-10-CM | POA: Diagnosis not present

## 2020-09-12 DIAGNOSIS — I10 Essential (primary) hypertension: Secondary | ICD-10-CM | POA: Diagnosis not present

## 2020-09-12 DIAGNOSIS — R202 Paresthesia of skin: Secondary | ICD-10-CM | POA: Diagnosis not present

## 2020-10-18 DIAGNOSIS — E538 Deficiency of other specified B group vitamins: Secondary | ICD-10-CM | POA: Diagnosis not present

## 2020-10-18 DIAGNOSIS — Z79899 Other long term (current) drug therapy: Secondary | ICD-10-CM | POA: Diagnosis not present

## 2020-10-18 DIAGNOSIS — D509 Iron deficiency anemia, unspecified: Secondary | ICD-10-CM | POA: Diagnosis not present

## 2020-10-20 DIAGNOSIS — I10 Essential (primary) hypertension: Secondary | ICD-10-CM | POA: Diagnosis not present

## 2020-10-20 DIAGNOSIS — Z0000 Encounter for general adult medical examination without abnormal findings: Secondary | ICD-10-CM | POA: Diagnosis not present

## 2021-03-08 DIAGNOSIS — H25033 Anterior subcapsular polar age-related cataract, bilateral: Secondary | ICD-10-CM | POA: Diagnosis not present

## 2021-03-24 IMAGING — US US EXTREM LOW VENOUS*R*
1 series · 13 of 24 positions shown · non-contrast
Comparison: None.

CLINICAL DATA: Acute right leg pain



[Series 1: us extrem low venous*right* · 0.08mm/px · 13 of 37 slices shown]
[im 1/37]
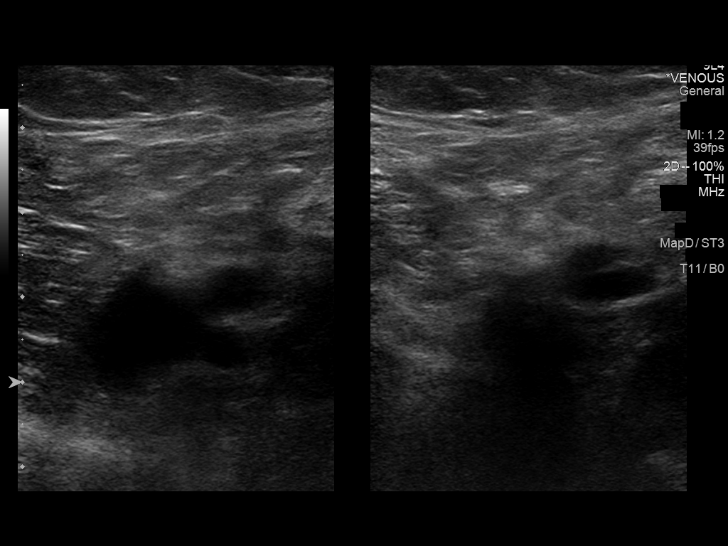
[im 4/37]
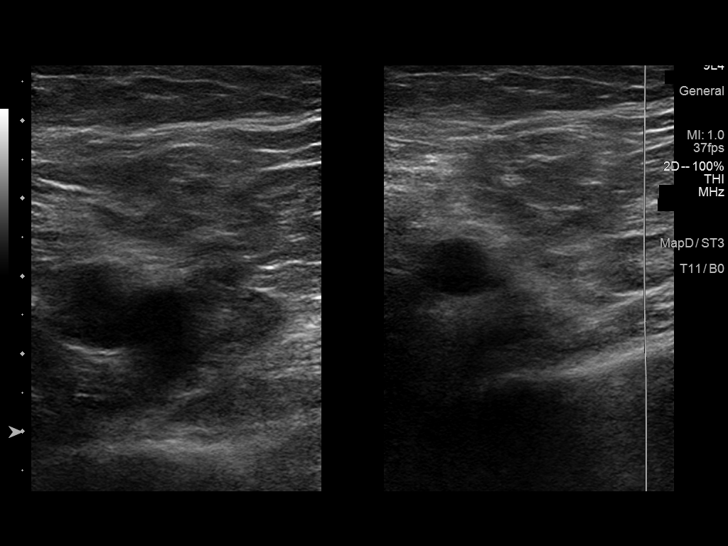
[im 7/37]
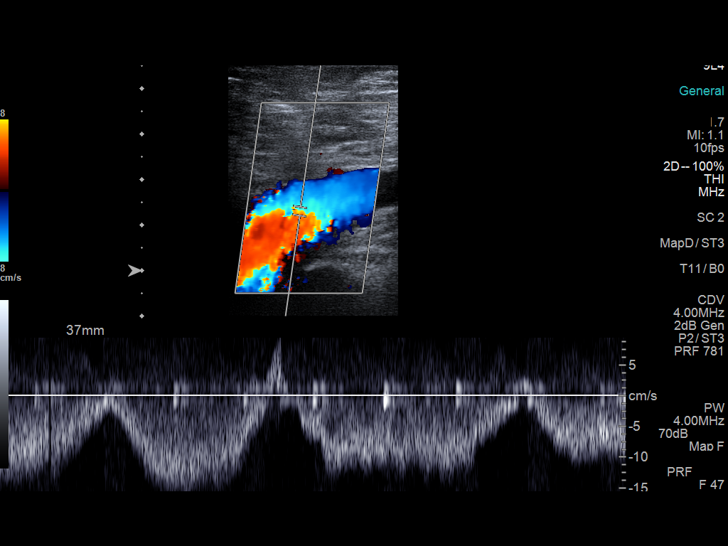
[im 10/37]
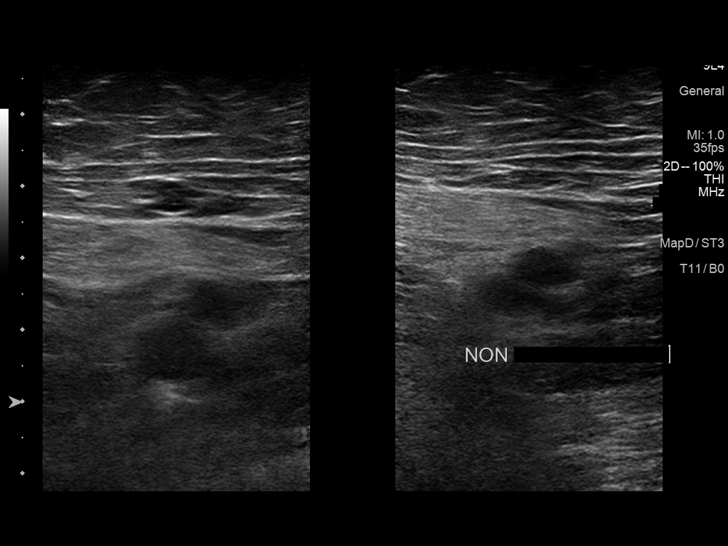
[im 13/37]
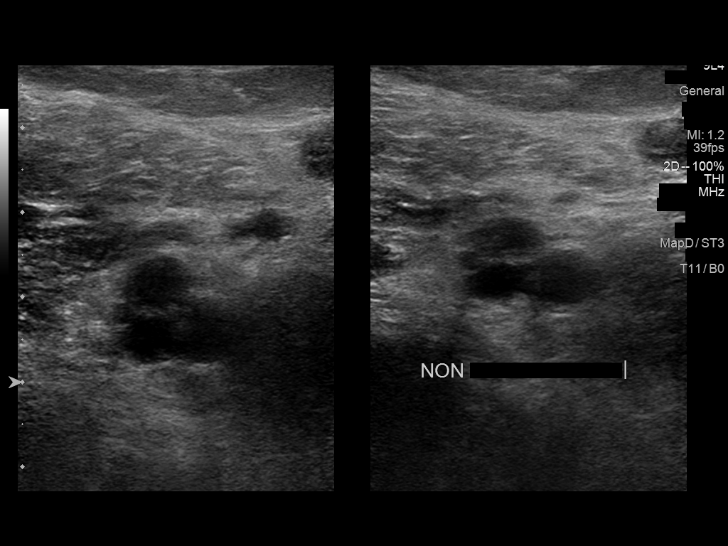
[im 16/37]
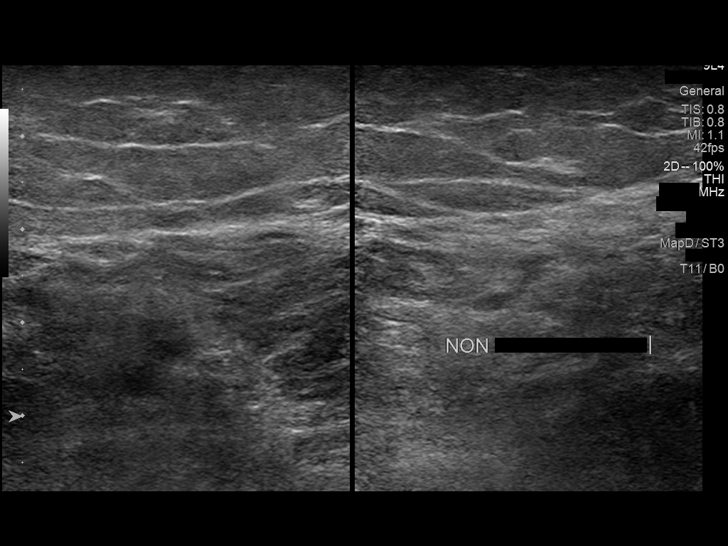
[im 19/37]
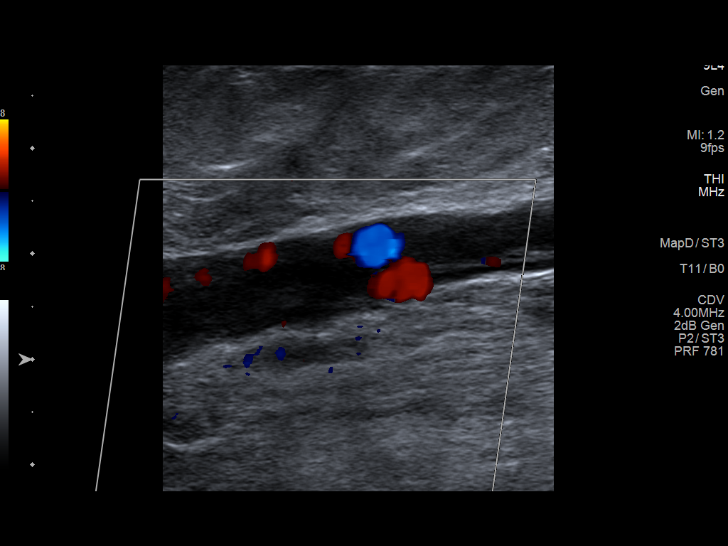
[im 21/37]
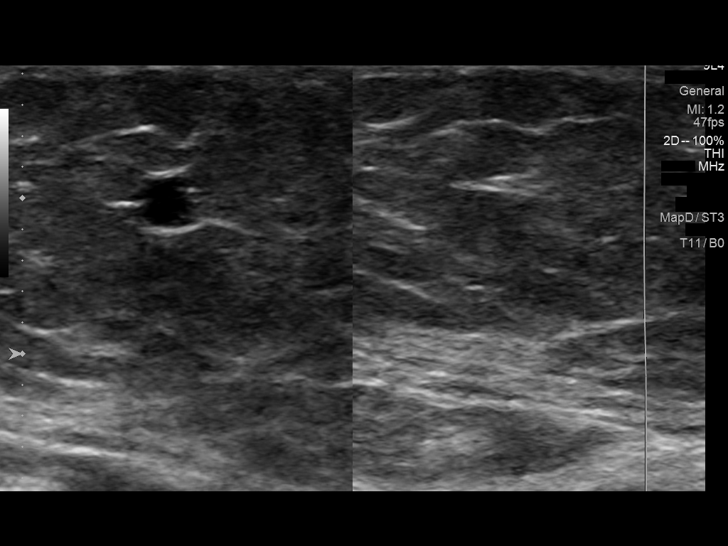
[im 24/37]
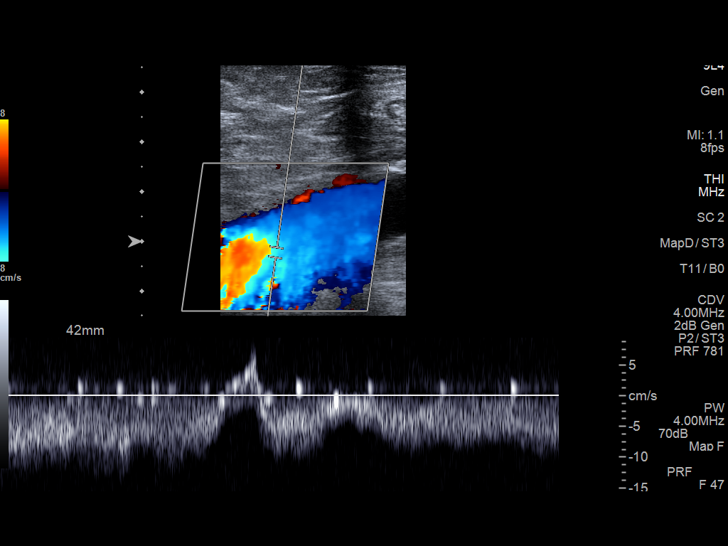
[im 27/37]
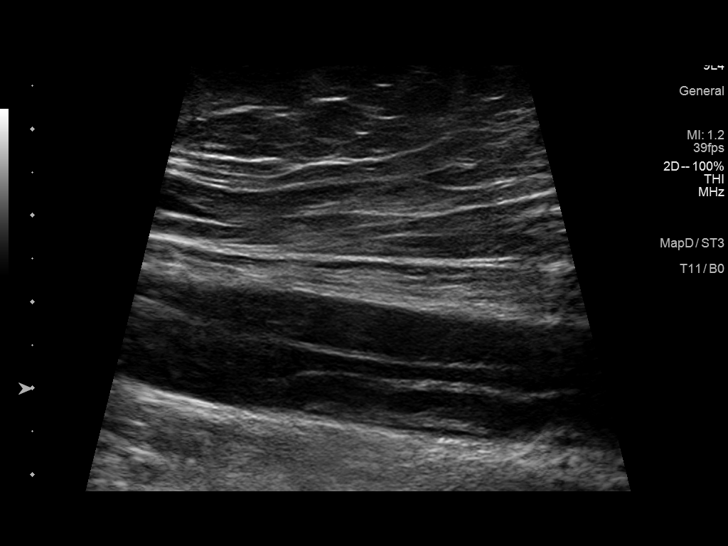
[im 30/37]
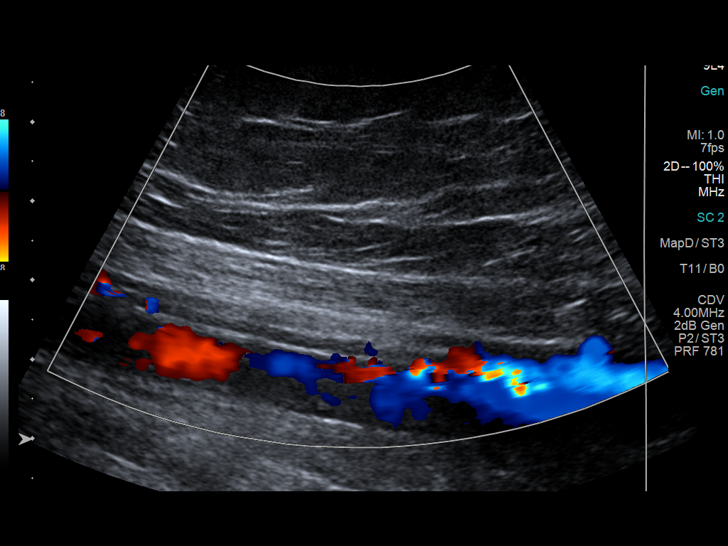
[im 33/37]
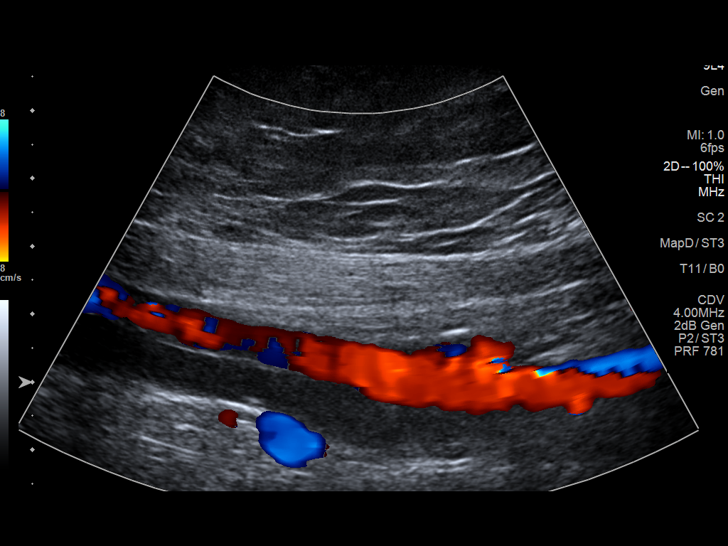
[im 37/37]
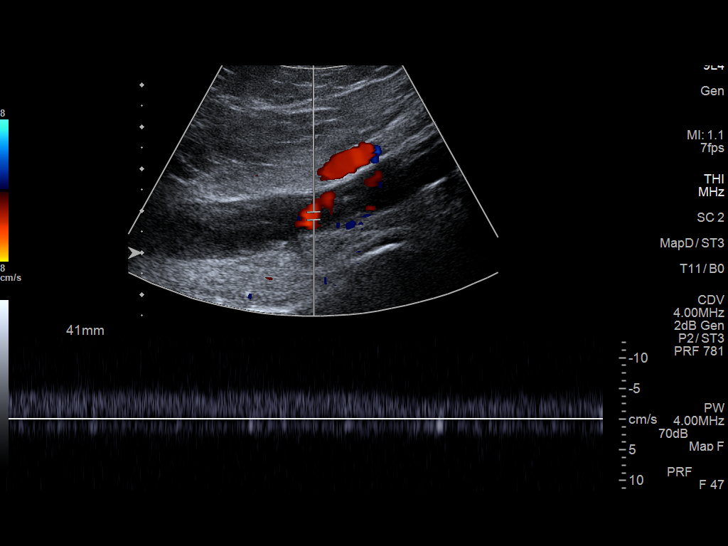

[13 of 24 positions shown; findings below may reference images not displayed]

FINDINGS: Contralateral Common Femoral Vein: Respiratory phasicity is normal
and symmetric with the symptomatic side. No evidence of thrombus.
Normal compressibility.

Common Femoral Vein: No evidence of thrombus. Normal
compressibility, respiratory phasicity and response to augmentation.

Saphenofemoral Junction: No evidence of thrombus. Normal
compressibility and flow on color Doppler imaging.

Profunda Femoral Vein: No evidence of thrombus. Normal
compressibility and flow on color Doppler imaging.

Femoral Vein: Hypoechoic thrombus throughout. Vessel is
noncompressible. Thrombus appears occlusive. No phasic flow
demonstrated.

Popliteal Vein: Hypoechoic thrombus appearing occlusive. Vessel
noncompressible. No phasic flow.

Calf Veins: Thrombus does appear to extend into the tibial and
peroneal calf veins.

Superficial Great Saphenous Vein: No evidence of thrombus. Normal
compressibility.

Venous Reflux:  Not assessed

Other Findings: Thrombus also appears to extend into the small
saphenous vein but nonocclusive. Vessel is partially compressible.
IMPRESSION: Positive exam for acute femoropopliteal DVT extending into the calf
tibial veins and the small saphenous vein.

## 2021-04-20 DIAGNOSIS — I10 Essential (primary) hypertension: Secondary | ICD-10-CM | POA: Diagnosis not present

## 2021-04-20 DIAGNOSIS — Z79899 Other long term (current) drug therapy: Secondary | ICD-10-CM | POA: Diagnosis not present

## 2021-04-20 DIAGNOSIS — K900 Celiac disease: Secondary | ICD-10-CM | POA: Diagnosis not present

## 2021-04-20 DIAGNOSIS — Z9189 Other specified personal risk factors, not elsewhere classified: Secondary | ICD-10-CM | POA: Diagnosis not present

## 2021-04-20 DIAGNOSIS — K219 Gastro-esophageal reflux disease without esophagitis: Secondary | ICD-10-CM | POA: Diagnosis not present

## 2021-04-20 DIAGNOSIS — E538 Deficiency of other specified B group vitamins: Secondary | ICD-10-CM | POA: Diagnosis not present

## 2021-04-20 DIAGNOSIS — I82411 Acute embolism and thrombosis of right femoral vein: Secondary | ICD-10-CM | POA: Diagnosis not present

## 2021-04-20 DIAGNOSIS — N3281 Overactive bladder: Secondary | ICD-10-CM | POA: Diagnosis not present

## 2021-04-20 DIAGNOSIS — E669 Obesity, unspecified: Secondary | ICD-10-CM | POA: Diagnosis not present

## 2021-04-20 DIAGNOSIS — R202 Paresthesia of skin: Secondary | ICD-10-CM | POA: Diagnosis not present

## 2021-04-20 DIAGNOSIS — C50912 Malignant neoplasm of unspecified site of left female breast: Secondary | ICD-10-CM | POA: Diagnosis not present

## 2021-05-14 DIAGNOSIS — R202 Paresthesia of skin: Secondary | ICD-10-CM | POA: Diagnosis not present

## 2021-05-14 DIAGNOSIS — I10 Essential (primary) hypertension: Secondary | ICD-10-CM | POA: Diagnosis not present

## 2021-06-29 DIAGNOSIS — U071 COVID-19: Secondary | ICD-10-CM | POA: Diagnosis not present

## 2021-07-10 DIAGNOSIS — G629 Polyneuropathy, unspecified: Secondary | ICD-10-CM | POA: Diagnosis not present

## 2021-07-10 DIAGNOSIS — Z7901 Long term (current) use of anticoagulants: Secondary | ICD-10-CM | POA: Diagnosis not present

## 2021-07-11 DIAGNOSIS — J069 Acute upper respiratory infection, unspecified: Secondary | ICD-10-CM | POA: Diagnosis not present

## 2021-07-13 DIAGNOSIS — R059 Cough, unspecified: Secondary | ICD-10-CM | POA: Diagnosis not present

## 2021-07-16 DIAGNOSIS — I10 Essential (primary) hypertension: Secondary | ICD-10-CM | POA: Diagnosis not present

## 2021-07-16 DIAGNOSIS — G629 Polyneuropathy, unspecified: Secondary | ICD-10-CM | POA: Diagnosis not present

## 2021-07-31 DIAGNOSIS — E669 Obesity, unspecified: Secondary | ICD-10-CM | POA: Diagnosis not present

## 2021-07-31 DIAGNOSIS — Z853 Personal history of malignant neoplasm of breast: Secondary | ICD-10-CM | POA: Diagnosis not present

## 2021-07-31 DIAGNOSIS — I10 Essential (primary) hypertension: Secondary | ICD-10-CM | POA: Diagnosis not present

## 2021-07-31 DIAGNOSIS — I82511 Chronic embolism and thrombosis of right femoral vein: Secondary | ICD-10-CM | POA: Diagnosis not present
